# Patient Record
Sex: Male | Born: 1935 | Race: White | Hispanic: No | Marital: Single | Smoking: Former smoker
Health system: Southern US, Community
[De-identification: ages and names within clinical notes are randomized; demographics above are authoritative.]

## PROBLEM LIST (undated history)

## (undated) DIAGNOSIS — I5032 Chronic diastolic (congestive) heart failure: Secondary | ICD-10-CM

## (undated) DIAGNOSIS — E785 Hyperlipidemia, unspecified: Secondary | ICD-10-CM

## (undated) DIAGNOSIS — I208 Other forms of angina pectoris: Secondary | ICD-10-CM

## (undated) DIAGNOSIS — I2510 Atherosclerotic heart disease of native coronary artery without angina pectoris: Secondary | ICD-10-CM

## (undated) DIAGNOSIS — H409 Unspecified glaucoma: Secondary | ICD-10-CM

## (undated) DIAGNOSIS — I639 Cerebral infarction, unspecified: Secondary | ICD-10-CM

## (undated) DIAGNOSIS — I10 Essential (primary) hypertension: Secondary | ICD-10-CM

## (undated) DIAGNOSIS — E860 Dehydration: Secondary | ICD-10-CM

## (undated) HISTORY — DX: Cerebral infarction, unspecified: I63.9

## (undated) HISTORY — DX: Other forms of angina pectoris: I20.8

## (undated) HISTORY — PX: UMBILICAL HERNIA REPAIR: SHX196

## (undated) HISTORY — PX: CORONARY ARTERY BYPASS GRAFT: SHX141

## (undated) HISTORY — DX: Dehydration: E86.0

## (undated) HISTORY — DX: Chronic diastolic (congestive) heart failure: I50.32

## (undated) HISTORY — PX: CATARACT EXTRACTION, BILATERAL: SHX1313

---

## 2010-04-24 ENCOUNTER — Encounter: Admission: RE | Admit: 2010-04-24 | Discharge: 2010-05-04 | Payer: Self-pay | Source: Home / Self Care

## 2010-04-30 ENCOUNTER — Encounter (INDEPENDENT_AMBULATORY_CARE_PROVIDER_SITE_OTHER): Payer: Self-pay | Admitting: Emergency Medicine

## 2010-04-30 ENCOUNTER — Emergency Department (HOSPITAL_COMMUNITY)
Admission: EM | Admit: 2010-04-30 | Discharge: 2010-04-30 | Payer: Self-pay | Source: Home / Self Care | Admitting: Emergency Medicine

## 2010-05-09 ENCOUNTER — Encounter
Admission: RE | Admit: 2010-05-09 | Discharge: 2010-05-31 | Payer: Self-pay | Source: Home / Self Care | Attending: Family Medicine | Admitting: Family Medicine

## 2010-05-16 ENCOUNTER — Encounter: Admit: 2010-05-16 | Payer: Self-pay | Admitting: Family Medicine

## 2010-05-19 ENCOUNTER — Inpatient Hospital Stay (HOSPITAL_COMMUNITY)
Admission: EM | Admit: 2010-05-19 | Discharge: 2010-05-23 | Payer: Self-pay | Source: Home / Self Care | Attending: Internal Medicine | Admitting: Internal Medicine

## 2010-05-19 ENCOUNTER — Emergency Department (HOSPITAL_COMMUNITY)
Admission: EM | Admit: 2010-05-19 | Discharge: 2010-05-19 | Payer: Self-pay | Source: Home / Self Care | Admitting: Emergency Medicine

## 2010-05-22 ENCOUNTER — Encounter (INDEPENDENT_AMBULATORY_CARE_PROVIDER_SITE_OTHER): Payer: Self-pay | Admitting: Internal Medicine

## 2010-05-22 LAB — CARDIAC PANEL(CRET KIN+CKTOT+MB+TROPI)
CK, MB: 3 ng/mL (ref 0.3–4.0)
CK, MB: 4.2 ng/mL — ABNORMAL HIGH (ref 0.3–4.0)
Relative Index: 2.9 — ABNORMAL HIGH (ref 0.0–2.5)
Relative Index: 2.3 (ref 0.0–2.5)
Total CK: 104 U/L (ref 7–232)
Total CK: 183 U/L (ref 7–232)
Troponin I: 0.01 ng/mL (ref 0.00–0.06)
Troponin I: 0.01 ng/mL (ref 0.00–0.06)

## 2010-05-22 LAB — CBC
HCT: 38 % — ABNORMAL LOW (ref 39.0–52.0)
HCT: 33.8 % — ABNORMAL LOW (ref 39.0–52.0)
HCT: 35.3 % — ABNORMAL LOW (ref 39.0–52.0)
Hemoglobin: 12.5 g/dL — ABNORMAL LOW (ref 13.0–17.0)
Hemoglobin: 11.1 g/dL — ABNORMAL LOW (ref 13.0–17.0)
Hemoglobin: 11.5 g/dL — ABNORMAL LOW (ref 13.0–17.0)
MCH: 30.1 pg (ref 26.0–34.0)
MCH: 30.2 pg (ref 26.0–34.0)
MCH: 30 pg (ref 26.0–34.0)
MCHC: 32.9 g/dL (ref 30.0–36.0)
MCHC: 32.8 g/dL (ref 30.0–36.0)
MCHC: 32.6 g/dL (ref 30.0–36.0)
MCV: 91.6 fL (ref 78.0–100.0)
MCV: 91.8 fL (ref 78.0–100.0)
MCV: 92.2 fL (ref 78.0–100.0)
Platelets: 205 10*3/uL (ref 150–400)
Platelets: 188 10*3/uL (ref 150–400)
Platelets: 167 10*3/uL (ref 150–400)
RBC: 4.15 MIL/uL — ABNORMAL LOW (ref 4.22–5.81)
RBC: 3.68 MIL/uL — ABNORMAL LOW (ref 4.22–5.81)
RBC: 3.83 MIL/uL — ABNORMAL LOW (ref 4.22–5.81)
RDW: 13.9 % (ref 11.5–15.5)
RDW: 14.1 % (ref 11.5–15.5)
RDW: 14.3 % (ref 11.5–15.5)
WBC: 11.9 10*3/uL — ABNORMAL HIGH (ref 4.0–10.5)
WBC: 6.2 10*3/uL (ref 4.0–10.5)
WBC: 6.3 10*3/uL (ref 4.0–10.5)

## 2010-05-22 LAB — URINE CULTURE
Colony Count: 4000
Culture  Setup Time: 201201132201

## 2010-05-22 LAB — URINE MICROSCOPIC-ADD ON

## 2010-05-22 LAB — BASIC METABOLIC PANEL
BUN: 13 mg/dL (ref 6–23)
BUN: 9 mg/dL (ref 6–23)
CO2: 27 mEq/L (ref 19–32)
CO2: 26 mEq/L (ref 19–32)
Calcium: 9.1 mg/dL (ref 8.4–10.5)
Calcium: 8.7 mg/dL (ref 8.4–10.5)
Chloride: 106 mEq/L (ref 96–112)
Chloride: 110 mEq/L (ref 96–112)
Creatinine, Ser: 1.03 mg/dL (ref 0.4–1.5)
Creatinine, Ser: 0.95 mg/dL (ref 0.4–1.5)
GFR calc Af Amer: >60 mL/min (ref >60)
GFR calc Af Amer: >60 mL/min (ref >60)
GFR calc non Af Amer: >60 mL/min (ref >60)
GFR calc non Af Amer: >60 mL/min (ref >60)
Glucose, Bld: 187 mg/dL — ABNORMAL HIGH (ref 70–99)
Glucose, Bld: 95 mg/dL (ref 70–99)
Potassium: 4.2 mEq/L (ref 3.5–5.1)
Potassium: 4.1 mEq/L (ref 3.5–5.1)
Sodium: 141 mEq/L (ref 135–145)
Sodium: 141 mEq/L (ref 135–145)

## 2010-05-22 LAB — LIPID PANEL
Cholesterol: 104 mg/dL (ref 0–200)
HDL: 38 mg/dL — ABNORMAL LOW (ref >39)
LDL Cholesterol: 51 mg/dL (ref 0–99)
Total CHOL/HDL Ratio: 2.7 RATIO
Triglycerides: 76 mg/dL (ref <150)
VLDL: 15 mg/dL (ref 0–40)

## 2010-05-22 LAB — D-DIMER, QUANTITATIVE: D-Dimer, Quant: 5.73 ug/mL-FEU — ABNORMAL HIGH (ref 0.00–0.48)

## 2010-05-22 LAB — DIFFERENTIAL
Basophils Absolute: 0 10*3/uL (ref 0.0–0.1)
Basophils Relative: 0 % (ref 0–1)
Eosinophils Absolute: 0 10*3/uL (ref 0.0–0.7)
Eosinophils Relative: 0 % (ref 0–5)
Lymphocytes Relative: 7 % — ABNORMAL LOW (ref 12–46)
Lymphs Abs: 0.8 10*3/uL (ref 0.7–4.0)
Monocytes Absolute: 0.9 10*3/uL (ref 0.1–1.0)
Monocytes Relative: 7 % (ref 3–12)
Neutro Abs: 10.1 10*3/uL — ABNORMAL HIGH (ref 1.7–7.7)
Neutrophils Relative %: 85 % — ABNORMAL HIGH (ref 43–77)

## 2010-05-22 LAB — TROPONIN I: Troponin I: 0.02 ng/mL (ref 0.00–0.06)

## 2010-05-22 LAB — TSH: TSH: 2.723 u[IU]/mL (ref 0.350–4.500)

## 2010-05-22 LAB — URINALYSIS, ROUTINE W REFLEX MICROSCOPIC
Bilirubin Urine: NEGATIVE
Hgb urine dipstick: NEGATIVE
Ketones, ur: 15 mg/dL — AB
Leukocytes, UA: NEGATIVE
Nitrite: NEGATIVE
Protein, ur: 30 mg/dL — AB
Specific Gravity, Urine: 1.025 (ref 1.005–1.030)
Urine Glucose, Fasting: NEGATIVE mg/dL
Urobilinogen, UA: 1 mg/dL (ref 0.0–1.0)
pH: 5.5 (ref 5.0–8.0)

## 2010-05-22 LAB — POCT CARDIAC MARKERS
CKMB, poc: 1.2 ng/mL (ref 1.0–8.0)
Myoglobin, poc: 152 ng/mL (ref 12–200)
Troponin i, poc: <0.05 ng/mL (ref 0.00–0.09)

## 2010-05-22 LAB — CK TOTAL AND CKMB (NOT AT ARMC)
CK, MB: 3 ng/mL (ref 0.3–4.0)
Relative Index: 2.9 — ABNORMAL HIGH (ref 0.0–2.5)
Total CK: 105 U/L (ref 7–232)

## 2010-05-22 LAB — RPR: RPR Ser Ql: NONREACTIVE

## 2010-05-22 LAB — FOLATE: Folate: >20 ng/mL

## 2010-05-22 LAB — LACTIC ACID, PLASMA: Lactic Acid, Venous: 2.8 mmol/L — ABNORMAL HIGH (ref 0.5–2.2)

## 2010-05-22 LAB — VITAMIN B12: Vitamin B-12: 982 pg/mL — ABNORMAL HIGH (ref 211–911)

## 2010-05-23 ENCOUNTER — Encounter: Admit: 2010-05-23 | Payer: Self-pay | Admitting: Family Medicine

## 2010-05-27 NOTE — H&P (Addendum)
NAME:  Bruce Clarke, Bruce Clarke                ACCOUNT NO.:  [id number]  MEDICAL RECORD NO.:  [id number]          PATIENT TYPE:  EMS  LOCATION:  MAJO                         FACILITY:  [facility]  PHYSICIAN:  Homero Fellers, MD   DATE OF BIRTH:  19-Jan-1917  DATE OF ADMISSION:  05/19/2010 DATE OF DISCHARGE:                             HISTORY & PHYSICAL   PRIMARY CARE PHYSICIAN:  None.  CHIEF COMPLAINT:  Seizure and nausea.  HISTORY OF PRESENT ILLNESS:  This is 77 year old Caucasian gentleman who currently lives with his daughter.  He presented to the emergency room after an episode of seizure while getting ready to have dinner earlier today.  There was no family member present at the bedside to give me a witness account but going by emergency room record, the patient was said to have slumped over with a glossy eye look and then had a convulsive episode followed by several episodes of vomiting.  There was short postictal phase but the patient is currently alert and oriented and pretty much aware of his environment.  According to the patient, there is no prior history of seizure.  The patient had bypass heart surgery about a month and half ago at [facility] in [facility] and has been doing well post surgery.  He currently denies any chest pain, shortness of breath or fever.  The patient also denies any headache or blurred vision.  He is currently having nausea.  He vomited after his seizure activity.  His initial vital signs in the emergency room did not show any evidence of fever or tachycardia.  Other history is limited because of the patient unwilling to provide further information because of his nausea.  PAST MEDICAL HISTORY:  Includes coronary artery disease, status post CABG; high blood pressure; hyperlipidemia.  CURRENT MEDICATIONS:  He is currently on amiodarone, which could be for atrial fibrillation, which is not verified at this time.  Medications include zolpidem,  Tylenol, Zocor, metoprolol, Colace, aspirin, and amiodarone.  ALLERGIES:  IBUPROFEN.  SOCIAL HISTORY:  No smoking.  No alcohol use.  FAMILY HISTORY:  Noncontributory.  REVIEW OF SYSTEMS:  Ten-point review of systems is still as above.  PHYSICAL EXAMINATION:  VITAL SIGNS:  Blood pressure is 145/44, pulse 50- 74, respirations 22, temp 97.9. GENERAL:  The patient is lying in bed, appeared to be uncomfortable because of his nausea. HEENT:  Pallor.  Extraocular muscles are intact. NECK:  Supple.  No JVD, adenopathy, or thyromegaly. LUNGS: Show reduced breath sounds at left lung base with no wheezing, no crackles. HEART:  S1, S2 regular rate and rhythm.  No murmurs, rubs or gallop. ABDOMEN:  Full, soft, nontender.  Bowel sound present.  No masses. EXTREMITIES:  Trace edema bilaterally. NEUROLOGIC:  No deficits.  Speech is clear.  LABORATORY:  His urinalysis does not suggest urinary tract infection. White count is 11.9, hemoglobin 12.5, platelet count is 205.  Chemistry; sodium is 141, potassium 4.2, BUN 13, creatinine 1.03.  Cardiac monitor showed no evidence of myocardial injury.  His EKG showed sinus rhythm with left bundle-branch block.  There is no old EKG to compare with.  Head CT showed no acute findings.  Chest x-ray showed left lower lobe infiltrate and consolidation with possible effusion.  There is no pulmonary edema.  ASSESSMENT:  A 77 year old man admitted with: 1. New onset seizure activity. 2. Left lower lobe pneumonia. 3. Mild leucocytosis. 4. High blood pressure. 5. Hyperlipidemia. 6. History of coronary disease, status post recent CABG.  PLAN: 1. The patient admitted to telemetry.  It is not clear at this point     to what could have precipitated his seizure activity.  His initial     head CT was normal.  He may also need a brain MRI on this     admission.  I will also recommend a neurological evaluation.  This     can be obtained tomorrow by the hospitalist.   For now, we will     start him on Keppra 500 mg IV q.12 and use Ativan p.r.n. seizure     activity.  I will also do a 2D echo and carotid Doppler and check     his lipid profile and TSH, B12 and folic acid levels. 2. Left lower lobe pneumonia.  The patient to be commenced on     antibiotics.  It is unclear whether pneumonia could be precipitant     for his seizure.  Blood cultures to be sent. 3. Leukocytosis, this may secondary to infection. 4. Hyperlipidemia.  Continue Zocor.  Estimated time spent is about 42 minutes.  Overall condition is stable.     Homero Fellers, MD     FA/MEDQ  D:  05/20/2010  T:  05/20/2010  Job:  [id number]  Electronically Signed by Homero Fellers  on 05/27/2010 10:47:05 PM

## 2010-05-29 LAB — CULTURE, BLOOD (ROUTINE X 2)
Culture  Setup Time: 201201140608
Culture  Setup Time: 201201140608
Culture: NO GROWTH
Culture: NO GROWTH

## 2010-05-31 ENCOUNTER — Encounter: Admit: 2010-05-31 | Payer: Self-pay | Admitting: Family Medicine

## 2010-06-02 ENCOUNTER — Inpatient Hospital Stay (HOSPITAL_COMMUNITY)
Admission: AD | Admit: 2010-06-02 | Discharge: 2010-06-07 | DRG: 603 | Disposition: A | Discharge status: Home / Self Care | Payer: Medicare Other | Source: Other Acute Inpatient Hospital | Attending: Internal Medicine | Admitting: Internal Medicine

## 2010-06-02 ENCOUNTER — Emergency Department (HOSPITAL_BASED_OUTPATIENT_CLINIC_OR_DEPARTMENT_OTHER)
Admission: EM | Admit: 2010-06-02 | Discharge: 2010-06-02 | Disposition: A | Discharge status: Other Acute Inpatient Hospital | Payer: Self-pay | Source: Home / Self Care | Admitting: Emergency Medicine

## 2010-06-02 DIAGNOSIS — I872 Venous insufficiency (chronic) (peripheral): Secondary | ICD-10-CM | POA: Diagnosis present

## 2010-06-02 DIAGNOSIS — E236 Other disorders of pituitary gland: Secondary | ICD-10-CM | POA: Diagnosis present

## 2010-06-02 DIAGNOSIS — Z951 Presence of aortocoronary bypass graft: Secondary | ICD-10-CM

## 2010-06-02 DIAGNOSIS — D649 Anemia, unspecified: Secondary | ICD-10-CM | POA: Diagnosis present

## 2010-06-02 DIAGNOSIS — L02419 Cutaneous abscess of limb, unspecified: Principal | ICD-10-CM | POA: Diagnosis present

## 2010-06-02 DIAGNOSIS — M169 Osteoarthritis of hip, unspecified: Secondary | ICD-10-CM | POA: Diagnosis present

## 2010-06-02 DIAGNOSIS — M7989 Other specified soft tissue disorders: Secondary | ICD-10-CM

## 2010-06-02 DIAGNOSIS — I10 Essential (primary) hypertension: Secondary | ICD-10-CM | POA: Diagnosis present

## 2010-06-02 DIAGNOSIS — M79609 Pain in unspecified limb: Secondary | ICD-10-CM

## 2010-06-02 DIAGNOSIS — I2510 Atherosclerotic heart disease of native coronary artery without angina pectoris: Secondary | ICD-10-CM | POA: Diagnosis present

## 2010-06-02 DIAGNOSIS — M1610 Unilateral primary osteoarthritis, unspecified hip: Secondary | ICD-10-CM | POA: Diagnosis present

## 2010-06-02 DIAGNOSIS — L03119 Cellulitis of unspecified part of limb: Principal | ICD-10-CM | POA: Diagnosis present

## 2010-06-02 DIAGNOSIS — E785 Hyperlipidemia, unspecified: Secondary | ICD-10-CM | POA: Diagnosis present

## 2010-06-02 DIAGNOSIS — Z7982 Long term (current) use of aspirin: Secondary | ICD-10-CM

## 2010-06-02 DIAGNOSIS — N400 Enlarged prostate without lower urinary tract symptoms: Secondary | ICD-10-CM | POA: Diagnosis present

## 2010-06-02 DIAGNOSIS — Z87891 Personal history of nicotine dependence: Secondary | ICD-10-CM

## 2010-06-02 LAB — COMPREHENSIVE METABOLIC PANEL
ALT: 19 U/L (ref 0–53)
AST: 22 U/L (ref 0–37)
Albumin: 3.9 g/dL (ref 3.5–5.2)
CO2: 28 mEq/L (ref 19–32)
Chloride: 104 mEq/L (ref 96–112)
GFR calc Af Amer: >60 mL/min (ref >60)
GFR calc non Af Amer: >60 mL/min (ref >60)
Potassium: 4.7 mEq/L (ref 3.5–5.1)
Sodium: 144 mEq/L (ref 135–145)
Total Bilirubin: 0.9 mg/dL (ref 0.3–1.2)

## 2010-06-02 LAB — DIFFERENTIAL
Basophils Absolute: 0 10*3/uL (ref 0.0–0.1)
Basophils Relative: 0 % (ref 0–1)
Eosinophils Absolute: 0.1 10*3/uL (ref 0.0–0.7)
Eosinophils Relative: 1 % (ref 0–5)
Lymphocytes Relative: 18 % (ref 12–46)
Lymphs Abs: 1.3 10*3/uL (ref 0.7–4.0)
Monocytes Absolute: 0.8 10*3/uL (ref 0.1–1.0)
Monocytes Relative: 11 % (ref 3–12)
Neutro Abs: 5 10*3/uL (ref 1.7–7.7)
Neutrophils Relative %: 70 % (ref 43–77)

## 2010-06-02 LAB — CBC
HCT: 36.3 % — ABNORMAL LOW (ref 39.0–52.0)
Hemoglobin: 12.3 g/dL — ABNORMAL LOW (ref 13.0–17.0)
MCH: 30.1 pg (ref 26.0–34.0)
MCHC: 33.9 g/dL (ref 30.0–36.0)
MCV: 88.8 fL (ref 78.0–100.0)
Platelets: 263 10*3/uL (ref 150–400)
RBC: 4.09 MIL/uL — ABNORMAL LOW (ref 4.22–5.81)
RDW: 14.1 % (ref 11.5–15.5)
WBC: 7.1 10*3/uL (ref 4.0–10.5)

## 2010-06-03 LAB — COMPREHENSIVE METABOLIC PANEL
ALT: 10 U/L (ref 0–53)
Alkaline Phosphatase: 74 U/L (ref 39–117)
BUN: 8 mg/dL (ref 6–23)
CO2: 28 mEq/L (ref 19–32)
Calcium: 8.4 mg/dL (ref 8.4–10.5)
GFR calc non Af Amer: >60 mL/min (ref >60)
Glucose, Bld: 90 mg/dL (ref 70–99)
Potassium: 3.8 mEq/L (ref 3.5–5.1)
Total Protein: 5.1 g/dL — ABNORMAL LOW (ref 6.0–8.3)

## 2010-06-03 LAB — CBC
HCT: 33.4 % — ABNORMAL LOW (ref 39.0–52.0)
Hemoglobin: 11 g/dL — ABNORMAL LOW (ref 13.0–17.0)
MCHC: 32.9 g/dL (ref 30.0–36.0)
MCV: 90.3 fL (ref 78.0–100.0)
RDW: 14.6 % (ref 11.5–15.5)

## 2010-06-04 LAB — CBC
HCT: 35.5 % — ABNORMAL LOW (ref 39.0–52.0)
Hemoglobin: 11.4 g/dL — ABNORMAL LOW (ref 13.0–17.0)
MCHC: 32.1 g/dL (ref 30.0–36.0)
RBC: 3.88 MIL/uL — ABNORMAL LOW (ref 4.22–5.81)
WBC: 5.4 10*3/uL (ref 4.0–10.5)

## 2010-06-04 LAB — DIFFERENTIAL
Basophils Absolute: 0 10*3/uL (ref 0.0–0.1)
Basophils Relative: 1 % (ref 0–1)
Lymphocytes Relative: 26 % (ref 12–46)
Monocytes Absolute: 0.7 10*3/uL (ref 0.1–1.0)
Neutro Abs: 3 10*3/uL (ref 1.7–7.7)
Neutrophils Relative %: 56 % (ref 43–77)

## 2010-06-04 LAB — COMPREHENSIVE METABOLIC PANEL
ALT: 10 U/L (ref 0–53)
AST: 17 U/L (ref 0–37)
Alkaline Phosphatase: 82 U/L (ref 39–117)
CO2: 26 mEq/L (ref 19–32)
Calcium: 8.6 mg/dL (ref 8.4–10.5)
Chloride: 102 mEq/L (ref 96–112)
GFR calc Af Amer: >60 mL/min (ref >60)
GFR calc non Af Amer: >60 mL/min (ref >60)
Glucose, Bld: 103 mg/dL — ABNORMAL HIGH (ref 70–99)
Potassium: 3.7 mEq/L (ref 3.5–5.1)
Sodium: 139 mEq/L (ref 135–145)

## 2010-06-04 LAB — MAGNESIUM: Magnesium: 2.1 mg/dL (ref 1.5–2.5)

## 2010-06-05 LAB — MAGNESIUM: Magnesium: 2.1 mg/dL (ref 1.5–2.5)

## 2010-06-05 LAB — CBC
HCT: 36.6 % — ABNORMAL LOW (ref 39.0–52.0)
MCHC: 32.8 g/dL (ref 30.0–36.0)
MCV: 90.4 fL (ref 78.0–100.0)
Platelets: 272 10*3/uL (ref 150–400)
RDW: 14.5 % (ref 11.5–15.5)

## 2010-06-05 LAB — COMPREHENSIVE METABOLIC PANEL
Albumin: 3.1 g/dL — ABNORMAL LOW (ref 3.5–5.2)
BUN: 12 mg/dL (ref 6–23)
Calcium: 8.6 mg/dL (ref 8.4–10.5)
Glucose, Bld: 93 mg/dL (ref 70–99)
Sodium: 139 mEq/L (ref 135–145)
Total Protein: 5.3 g/dL — ABNORMAL LOW (ref 6.0–8.3)

## 2010-06-05 LAB — DIFFERENTIAL
Basophils Absolute: 0 10*3/uL (ref 0.0–0.1)
Eosinophils Absolute: 0.2 10*3/uL (ref 0.0–0.7)
Eosinophils Relative: 3 % (ref 0–5)
Lymphocytes Relative: 32 % (ref 12–46)
Monocytes Absolute: 0.7 10*3/uL (ref 0.1–1.0)

## 2010-06-05 LAB — VANCOMYCIN, TROUGH: Vancomycin Tr: 8.8 ug/mL — ABNORMAL LOW (ref 10.0–20.0)

## 2010-06-06 DIAGNOSIS — I739 Peripheral vascular disease, unspecified: Secondary | ICD-10-CM

## 2010-06-06 LAB — CORTISOL-AM, BLOOD: Cortisol - AM: 8.9 ug/dL (ref 4.3–22.4)

## 2010-06-07 ENCOUNTER — Encounter: Payer: Self-pay | Admitting: Physical Therapy

## 2010-06-07 LAB — CBC
Hemoglobin: 11.8 g/dL — ABNORMAL LOW (ref 13.0–17.0)
MCH: 29.4 pg (ref 26.0–34.0)
RBC: 4.01 MIL/uL — ABNORMAL LOW (ref 4.22–5.81)
RDW: 14.4 % (ref 11.5–15.5)
WBC: 5.8 10*3/uL (ref 4.0–10.5)

## 2010-06-07 LAB — BASIC METABOLIC PANEL
BUN: 9 mg/dL (ref 6–23)
Calcium: 8.8 mg/dL (ref 8.4–10.5)
GFR calc non Af Amer: >60 mL/min (ref >60)
Glucose, Bld: 93 mg/dL (ref 70–99)

## 2010-06-08 LAB — CULTURE, BLOOD (ROUTINE X 2)
Culture  Setup Time: 201201271948
Culture: NO GROWTH

## 2010-06-08 NOTE — Discharge Summary (Signed)
NAME:  Bruce Clarke, Bruce Clarke                ACCOUNT NO.:  [id number]  MEDICAL RECORD NO.:  [id number]           PATIENT TYPE:  LOCATION:                                 FACILITY:  PHYSICIAN:  Ladell Pier, M.D.   DATE OF BIRTH:  [birth year]  DATE OF ADMISSION:  05/20/2010 DATE OF DISCHARGE:  05/23/2010                              DISCHARGE SUMMARY   DISCHARGE DIAGNOSES: 1. Question of seizure versus a near syncopal episode. 2. Left lower lobe pneumonia. 3. Mild leukocytosis. 4. Hypertension. 5. Hyperlipidemia. 6. History of coronary artery disease status post coronary artery     bypass graft at [facility] in [place].  The patient has a followup appointment with the cardiologist on February 2.  DISCHARGE MEDICATIONS: 1. Levaquin 750 mg daily. 2. Tylenol p.r.n. 3. Amiodarone 200 mg daily. 4. Aspirin 325 mg daily. 5. Colace 100 mg daily. 6. Metoprolol 25 mg b.i.d. 7. Simvastatin 20 mg daily. 8. Tylenol over-the-counter with Benadryl daily at bedtime as needed     for sleep. 9. Ambien 5 mg at nighttime p.r.n.  FOLLOWUP APPOINTMENTS:  The patient to follow up with Dr. Azucena Cecil in 1 week.  PROCEDURE:  The patient had an EEG and MRI done that is still pending.  OTHER PROCEDURES: 1. CT of the chest.  Left lower lobe consolidation with associated     left pleural effusion, no evidence of PE. 2. Chest x-ray.  Moderate-size left pleural effusion and associated     atelectasis versus pneumonia in the left lower lobe. 3. CT scan of the head.  No acute intracranial abnormality.  Mild age-     appropriate generalized atrophy and mild chronic microvascular     ischemic changes. 4. X-ray of his left hip.  No acute finding.  Bilateral hip     osteoarthritis worse on the left. 5. X-ray of the left wrist.  No acute finding. 6. 2-D echo.  Left ventricle has some septal hypokinesis.  The cavity     size was normal, wall thickness was increased in a pattern of LVH,     systolic  function was normal, estimated EF of 50-55%.  There was no     regional wall motion abnormalities.  Atrium was mildly dilated, no     defect or patent foramen ovale was identified. 7. Carotid Dopplers.  No stenosis.  CONSULTANTS:  None.  HISTORY OF PRESENT ILLNESS:  The patient is a 77 year old Caucasian male who currently lives with his daughter after he had CABG.  He presented to the emergency room with one episode of seizure-like activity while getting ready to have dinner earlier today.  There was no family member present at the bedside to give history, but later on per discussion with the daughter, he had fallen earlier that day.  He had gone to the emergency room, was there all day, had x-rays done that were normal.  Then he came home, and then when he came home, he said he felt lightheaded.  When she went to get him a chair, he fell in the chair and then he had some jerky movements.  His eyes  rolled back and that lasted for a few minutes and then he regained consciousness and was able to answer questions.  He had no postictal episode.  The patient was brought to the emergency room.  Please see admission note for remainder of history, past medical history.  The patient also had several episodes of vomiting during the event.  Past medical history, family history, social history, meds, allergies, review of systems per admission H&P.  PHYSICAL EXAM AT THE TIME OF DISCHARGE:  VITAL SIGNS:  Temperature 97.3, pulse 66, respirations 18, blood pressure 151/74, pulse ox 95% on room air. GENERAL:  The patient was sitting up in chair today, well-nourished white male. HEENT:  Normocephalic, atraumatic.  Pupils reactive to light.  Throat without erythema. CARDIOVASCULAR:  Regular rate and rhythm. LUNGS:  Clear bilaterally. ABDOMEN:  Positive bowel sounds. EXTREMITIES:  Without edema.  HOSPITAL COURSE: 1. Question of seizure versus syncope/near syncope:  The patient was     admitted  to the hospital.  He had an MRI done yesterday, the result     is still pending.  He had CT scan of the chest to rule out PE that     was normal.  Head CT was normal.  Carotid Dopplers was negative.     He had a 2-D echo done that showed normal EF.  So, the etiology of     his syncope is not really known.  One of his troponin was normal at     0.01.  His MB was really mildly elevated at 4.2, but his relative     index was 2.3.  A D-dimer was elevated at 5.73 with negative CT     scan, so I am unsure of the etiology of his syncopal episode.  He     could have had an arrhythmia, but no arrhythmia so far on the     monitor here.  He will follow up with Dr. Azucena Cecil in the office and     then he could well keep his appointment to follow up at [facility].     His EEG is still pending.  I did discuss this with Dr. Thad Ranger,     Neurology, who feel that it does not sound like a seizure episode     since he does not have a postictal phase.  He is presently on     Keppra in the hospital, but per discussion with Neurology if his     EEG is normal, then discontinue the Keppra and he can be discharged     without any followup neurology wise and to follow up with PCP.  He     does not need any further neuro workup.  So the EEG and MRI is     pending. 2. Coronary artery disease status post CABG that is stable and he is     going to follow up outpatient with the cardiologist. 3. Hypertension.  We will continue him on his home medication. 4. Pneumonia.  He received Levaquin in the hospital.  He does not have     any shortness of breath.  He is able to ambulate fine without any     assistance.  He will be discharged on Levaquin for 5 more days and     then as mentioned before he will follow up with Dr. Azucena Cecil in the     office.  LABS AT THE TIME OF DISCHARGE:  Blood cultures negative x2.  Urine culture negative.  Sodium of  141, potassium 4.1, chloride 110, CO2 26, glucose 95, BUN 9, creatinine 0.95.  WBC  6.3, hemoglobin 11.5, MCV 92.2, platelet 167, RPR nonreactive.  Folate level greater than 20, vitamin B12 982, TSH of 2.723.  Lipid profile; total cholesterol 104, triglycerides 76, HDL 38, LDL 51.  D-dimer as mentioned before was 5.73. Lactic acid level 2.8.  Time spent with the patient and doing this discharge and discussing followup appointment with his daughter is approximately 45 minutes.     Ladell Pier, M.D.     NJ/MEDQ  D:  05/23/2010  T:  05/23/2010  Job:  [id number]  cc:   Dr. Genia Harold, M.D.  Electronically Signed by Ladell Pier M.D. on 06/08/2010 01:38:55 PM

## 2010-06-08 NOTE — Discharge Summary (Signed)
  NAME:  Bruce Clarke, Bruce Clarke                ACCOUNT NO.:  [id number]  MEDICAL RECORD NO.:  [id number]          PATIENT TYPE:  INP  LOCATION:  3707                         FACILITY:  [facility]  PHYSICIAN:  Ladell Pier, M.D.   DATE OF BIRTH:  1916-08-09  DATE OF ADMISSION:  05/19/2010 DATE OF DISCHARGE:  05/23/2010                              DISCHARGE SUMMARY   ADDENDUM:  The patient was discharged on January 17.  The patient had a MRI done that was negative.  His EEG was also negative.  The patient and daughter were told to follow up with the daughters PCP, Dr. Azucena Cecil.     Ladell Pier, M.D.     N[place]/MEDQ  D:  05/23/2010  T:  05/23/2010  Job:  [id number]  cc:   Tally Joe, M.D.  Electronically Signed by Ladell Pier M.D. on 06/08/2010 01:39:00 PM
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
## 2010-06-12 ENCOUNTER — Ambulatory Visit: Payer: Medicare Other | Attending: Family Medicine | Admitting: Physical Therapy

## 2010-06-12 DIAGNOSIS — M6281 Muscle weakness (generalized): Secondary | ICD-10-CM | POA: Insufficient documentation

## 2010-06-12 DIAGNOSIS — R5381 Other malaise: Secondary | ICD-10-CM | POA: Insufficient documentation

## 2010-06-12 DIAGNOSIS — IMO0001 Reserved for inherently not codable concepts without codable children: Secondary | ICD-10-CM | POA: Insufficient documentation

## 2010-06-13 ENCOUNTER — Ambulatory Visit: Payer: Medicare Other | Admitting: Physical Therapy

## 2010-06-14 ENCOUNTER — Ambulatory Visit: Payer: Medicare Other | Admitting: Physical Therapy

## 2010-06-15 ENCOUNTER — Ambulatory Visit (INDEPENDENT_AMBULATORY_CARE_PROVIDER_SITE_OTHER): Payer: Medicare Other | Admitting: Vascular Surgery

## 2010-06-15 ENCOUNTER — Encounter (INDEPENDENT_AMBULATORY_CARE_PROVIDER_SITE_OTHER): Payer: Medicare Other

## 2010-06-15 DIAGNOSIS — I872 Venous insufficiency (chronic) (peripheral): Secondary | ICD-10-CM

## 2010-06-15 DIAGNOSIS — I824Z9 Acute embolism and thrombosis of unspecified deep veins of unspecified distal lower extremity: Secondary | ICD-10-CM
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
## 2010-06-19 NOTE — Discharge Summary (Signed)
NAME:  Bruce Clarke, Bruce Clarke                ACCOUNT NO.:  [id number]  MEDICAL RECORD NO.:  [id number]           PATIENT TYPE:  I  LOCATION:  4508                         FACILITY:  [facility]  PHYSICIAN:  Marcellus Scott, MD     DATE OF BIRTH:  1916/09/22  DATE OF ADMISSION:  06/02/2010 DATE OF DISCHARGE:  06/07/2010                              DISCHARGE SUMMARY   PRIMARY CARE PHYSICIAN:  Dr. Karna Christmas.  VASCULAR SURGEON:  Janetta Hora. Fields, MD  CARDIOLOGIST:  Dr. Romilda Garret.  CARDIOTHORACIC SURGEON:  Dr. Delilah Shan at [facility].  DISCHARGE DIAGNOSES: 1. Left lower extremity cellulitis complicating underlying venous     stasis. 2. Left lower extremity venous insufficiency. 3. Coronary artery disease status post coronary artery bypass graft in     November 2011. 4. Empty sella on MRI, asymptomatic.  Outpatient followup as deemed     necessary. 5. Hypertension. 6. History of benign prostatic hypertrophy. 7. History of osteoarthritis. 8. History of dyslipidemia. 9. History of question seizures versus syncope on May 20, 2010. 10.Normocytic anemia.  DISCHARGE MEDICATIONS: 1. Doxycycline 100 mg p.o. b.i.d. 2. Acetaminophen 325-650 mg p.o. q.4 hourly p.r.n. for pain. 3. Amiodarone 200 mg p.o. daily. 4. Aspirin 325 mg p.o. daily. 5. Colace 200 mg p.o. every evening. 6. Metoprolol tartrate 12.5 mg p.o. b.i.d. 7. Simvastatin 20 mg p.o. every evening. 8. Vitamin B12 1 tablet p.o. daily. 9. Vitamin D over the counter 1 capsule p.o. daily. 10.Zolpidem 5 mg p.o. at bedtime p.r.n.  DISCONTINUED MEDICATION:  Tylenol PM over the counter.  IMAGING: 1. CT of the left lower extremity with contrast on the June 05, 2010.  Impression:  CT findings suggest cellulitis without evidence     of focal soft tissue abscess, myofascitis, or osteomyelitis. 2. X-ray of the lumbar spine.  Impression:  Normal alignment with     degenerative change  involving the facet joints of L5-S1.  No acute     abnormality. 3. Left lower extremity venous Doppler.  Impression:  No evidence of     lower extremity deep vein thrombosis. 4. Lower extremity arterial Dopplers.  Summary:  Right is 0.78, left     0.77 ankle brachial index.  LABORATORY DATA:  ACTH is 7 with normal range being 10-46.  Basic metabolic panel within normal limits.  BUN 9, creatinine 0.98.  CBC with hemoglobin 11.8, hematocrit 36.7, white blood cell 5.8, platelets 259, MCV 92.  A.m. cortisol 8.9, TSH 1.591, magnesium 2.1.  Hepatic panel only remarkable for total protein 5.3 and albumin of 3.1.  Blood cultures x2 are no growth to date.  ESR on admission was 17.  Venous lactate on admission was 2.  INR 1.04 and D-dimer was 2.37.  CONSULTATIONS:  Vascular surgeon, Dr. Fabienne Bruns.  DIET:  Heart-healthy diet.  ACTIVITIES:  Increase activity slowly.  The patient will also use a rolling walker with 5-inch wheels.  The patient will require Ace wrap to the left lower extremity foot to knee to be changed once daily.  COMPLAINTS TODAY:  The patient indicates that  the left lower extremity pain, swelling, redness has decreased by 80%, and he feels significantly better and comfortable to return home.  He is ambulating in the room. He denies any chest pain or dyspnea or dizziness or lightheadedness.  He denies any other complaints.  PHYSICAL EXAMINATION:  GENERAL:  The patient is in no obvious distress. VITAL SIGNS:  Temperature 97.6 degrees Fahrenheit; pulse was recorded as 45 per minute, but is actually 55 per minute at this time and ranges between 45-65 per minute; respirations 18 per minute, blood pressure 136/56 mmHg, and saturating at 98% on room air. NECK:  No JVD. RESPIRATORY:  Clear. CARDIOVASCULAR:  First and second heart sounds heard, regular. ABDOMEN:  Nondistended, nontender, soft, and bowel sounds present. CENTRAL NERVOUS SYSTEM:  The patient awake, alert,  oriented x3 with no focal neurological deficits. EXTREMITIES:  Grade 5/5 power.  Left lower extremity is mildly swollen below the knee with mild warmth, but no open wounds or redness or tenderness.  Peripheral pulses symmetrically felt.  HOSPITAL COURSE:  Mr. Stanislawski is a 77 year old Caucasian male patient with history of hypotension, hyperlipidemia, coronary artery disease status post coronary artery bypass graft, benign prostatic hypertrophy, empty sella on a recent MRI, recent admission for pneumonia who presented with left leg pain of 1 week duration.  There was some slight warmth.  Ultrasound on arrival was negative for deep vein thrombosis. He was now admitted for presumed cellulitis of the left lower extremity.  1. Left lower extremity cellulitis complicating underlying chronic     venous stasis.  The patient was admitted to the hospital.  As     indicated, DVT was ruled out.  He was placed on IV vancomycin and     Zosyn of which today he has completed 5 days.  His ankle brachial     indices were done, and the patient may have mild peripheral artery     disease.  Vascular surgeons were consulted, and they recommended     Ace wrapping his left lower extremity for the venous insufficiency,     and they will also follow him as an outpatient.  I have discussed     with Dr. Darrick Penna who has cleared him for discharge.  The patient     will be discharged on additional 10 days of oral antibiotics. 2. Coronary artery disease status post coronary artery bypass graft.     The patient is asymptomatic of any cardiorespiratory symptoms.  He     is actually on 12.5 p.o. b.i.d. of metoprolol at home, but was     placed on 25 b.i.d. in the hospital, which may contribute to some     of the bradycardia.  He is asymptomatic of this, however.  He will     be returning to his home dose of metoprolol.  I am unsure why he is     on the amiodarone.  He can follow up with his cardiologist. 3. Hypertension.   Reasonably controlled on above medications. 4. Empty sella on recent MRI.  The patient, however, is asymptomatic     and unclear as to the reason of his passing out recently.  Unsure     what to make out of the low TSH.  Recommend outpatient followup and     evaluation as deemed necessary.  Consider Neurology consultation if     deemed necessary.  DISPOSITION:  The patient's daughter and son-in-law were concerned about his living conditions and the safety of the patient  returning home to be alone.  However, long discussion has been held by the case manager with the daughter and the patient.  The patient will be returning to outpatient therapy, which was arranged by the [facility] and after that, the daughter may consider placing him at an assisted living facility as an outpatient.  The patient at this time is stable for discharge home.  FOLLOWUP RECOMMENDATIONS: 1. Dr. Karna Christmas.  The patient is to call for an appointment to     be seen in 5-7 days. 2. Dr. Fabienne Bruns.  MD's office will call with the appointment. 3. Dr. Romilda Garret.  The patient is to call for an appointment. 4. Dr. Delilah Shan.  The patient is to call for an appointment.  Time taken in coordinating this discharge is 35 minutes.     Marcellus Scott, MD     AH/MEDQ  D:  06/07/2010  T:  06/08/2010  Job:  [id number]  cc:   Dr. Karna Christmas Janetta Hora. Darrick Penna, MD Dr. Romilda Garret Dr. Delilah Shan  Electronically Signed by Marcellus Scott MD on 06/19/2010 12:00:07 AM
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
## 2010-06-29 ENCOUNTER — Ambulatory Visit: Payer: Medicare Other | Admitting: Vascular Surgery

## 2010-06-30 NOTE — Procedures (Unsigned)
DUPLEX DEEP VENOUS EXAM - LOWER EXTREMITY  INDICATION:  Left venous insufficiency.  HISTORY:  Edema:  Left lower extremity. Trauma/Surgery:  No. Pain:  Yes. PE:  No. Previous DVT:  Possibly in 1945. Anticoagulants:  No. Other:  Left lower extremity recurrent cellulitis.  DUPLEX EXAM:               CFV   SFV   PopV  PTV    GSV               R  L  R  L  R  L  R   L  R  L Thrombosis    O  o     o     o      P     o Spontaneous   +  +     +     +      +     + Phasic        +  +     +     +      +     + Augmentation  +  +     +     +      +     + Compressible  +  +     +     P      +     + Competent     +  0     0     0      +     +  Legend:  + - yes  o - no  p - partial  D - decreased  IMPRESSION: 1. Evidence of chronic DVT in the popliteal vein. 2. Unable to adequately visualize the left lower extremity calf veins     due to edema. 3. Clinically significant deep vein insufficiency is noted.   _____________________________ Janetta Hora Darrick Penna, MD  EM/MEDQ  D:  06/15/2010  T:  06/15/2010  Job:  [id number]

## 2010-07-17 LAB — DIFFERENTIAL
Basophils Relative: 1 % (ref 0–1)
Eosinophils Absolute: 0.4 10*3/uL (ref 0.0–0.7)
Neutrophils Relative %: 46 % (ref 43–77)

## 2010-07-17 LAB — CBC
MCH: 30.6 pg (ref 26.0–34.0)
MCHC: 32.9 g/dL (ref 30.0–36.0)
Platelets: 170 10*3/uL (ref 150–400)
RDW: 14 % (ref 11.5–15.5)

## 2011-02-28 DIAGNOSIS — I9789 Other postprocedural complications and disorders of the circulatory system, not elsewhere classified: Secondary | ICD-10-CM

## 2011-02-28 DIAGNOSIS — I4891 Unspecified atrial fibrillation: Secondary | ICD-10-CM | POA: Insufficient documentation

## 2012-05-29 DIAGNOSIS — I739 Peripheral vascular disease, unspecified: Secondary | ICD-10-CM | POA: Insufficient documentation

## 2016-01-06 DIAGNOSIS — I639 Cerebral infarction, unspecified: Secondary | ICD-10-CM

## 2016-01-06 HISTORY — DX: Cerebral infarction, unspecified: I63.9

## 2016-01-29 ENCOUNTER — Inpatient Hospital Stay (HOSPITAL_COMMUNITY)
Admission: EM | Admit: 2016-01-29 | Discharge: 2016-02-02 | DRG: 064 | Disposition: A | Discharge status: Skilled Nursing Facility | Payer: Medicare Other | Attending: Internal Medicine | Admitting: Internal Medicine

## 2016-01-29 ENCOUNTER — Encounter (HOSPITAL_COMMUNITY): Payer: Self-pay | Admitting: *Deleted

## 2016-01-29 ENCOUNTER — Emergency Department (HOSPITAL_COMMUNITY): Payer: Medicare Other

## 2016-01-29 DIAGNOSIS — R531 Weakness: Secondary | ICD-10-CM | POA: Diagnosis present

## 2016-01-29 DIAGNOSIS — E785 Hyperlipidemia, unspecified: Secondary | ICD-10-CM | POA: Diagnosis present

## 2016-01-29 DIAGNOSIS — I110 Hypertensive heart disease with heart failure: Secondary | ICD-10-CM | POA: Diagnosis present

## 2016-01-29 DIAGNOSIS — G936 Cerebral edema: Secondary | ICD-10-CM | POA: Diagnosis present

## 2016-01-29 DIAGNOSIS — R269 Unspecified abnormalities of gait and mobility: Secondary | ICD-10-CM

## 2016-01-29 DIAGNOSIS — I6789 Other cerebrovascular disease: Secondary | ICD-10-CM | POA: Diagnosis not present

## 2016-01-29 DIAGNOSIS — Z951 Presence of aortocoronary bypass graft: Secondary | ICD-10-CM

## 2016-01-29 DIAGNOSIS — Z8249 Family history of ischemic heart disease and other diseases of the circulatory system: Secondary | ICD-10-CM

## 2016-01-29 DIAGNOSIS — R001 Bradycardia, unspecified: Secondary | ICD-10-CM

## 2016-01-29 DIAGNOSIS — Z87891 Personal history of nicotine dependence: Secondary | ICD-10-CM

## 2016-01-29 DIAGNOSIS — I25810 Atherosclerosis of coronary artery bypass graft(s) without angina pectoris: Secondary | ICD-10-CM | POA: Diagnosis present

## 2016-01-29 DIAGNOSIS — I5032 Chronic diastolic (congestive) heart failure: Secondary | ICD-10-CM | POA: Diagnosis present

## 2016-01-29 DIAGNOSIS — Z7982 Long term (current) use of aspirin: Secondary | ICD-10-CM | POA: Diagnosis not present

## 2016-01-29 DIAGNOSIS — Z823 Family history of stroke: Secondary | ICD-10-CM

## 2016-01-29 DIAGNOSIS — I25118 Atherosclerotic heart disease of native coronary artery with other forms of angina pectoris: Secondary | ICD-10-CM | POA: Diagnosis present

## 2016-01-29 DIAGNOSIS — Z833 Family history of diabetes mellitus: Secondary | ICD-10-CM

## 2016-01-29 DIAGNOSIS — I639 Cerebral infarction, unspecified: Secondary | ICD-10-CM | POA: Diagnosis present

## 2016-01-29 DIAGNOSIS — E860 Dehydration: Secondary | ICD-10-CM | POA: Diagnosis present

## 2016-01-29 DIAGNOSIS — I10 Essential (primary) hypertension: Secondary | ICD-10-CM

## 2016-01-29 DIAGNOSIS — H409 Unspecified glaucoma: Secondary | ICD-10-CM

## 2016-01-29 DIAGNOSIS — I69311 Memory deficit following cerebral infarction: Secondary | ICD-10-CM

## 2016-01-29 DIAGNOSIS — I63441 Cerebral infarction due to embolism of right cerebellar artery: Secondary | ICD-10-CM | POA: Diagnosis present

## 2016-01-29 DIAGNOSIS — I2510 Atherosclerotic heart disease of native coronary artery without angina pectoris: Secondary | ICD-10-CM | POA: Diagnosis present

## 2016-01-29 DIAGNOSIS — I25709 Atherosclerosis of coronary artery bypass graft(s), unspecified, with unspecified angina pectoris: Secondary | ICD-10-CM | POA: Diagnosis not present

## 2016-01-29 DIAGNOSIS — D72829 Elevated white blood cell count, unspecified: Secondary | ICD-10-CM

## 2016-01-29 HISTORY — DX: Hyperlipidemia, unspecified: E78.5

## 2016-01-29 HISTORY — DX: Unspecified glaucoma: H40.9

## 2016-01-29 HISTORY — DX: Essential (primary) hypertension: I10

## 2016-01-29 HISTORY — DX: Atherosclerotic heart disease of native coronary artery without angina pectoris: I25.10

## 2016-01-29 LAB — URINALYSIS, ROUTINE W REFLEX MICROSCOPIC
BILIRUBIN URINE: NEGATIVE
GLUCOSE, UA: NEGATIVE mg/dL
Hgb urine dipstick: NEGATIVE
KETONES UR: NEGATIVE mg/dL
LEUKOCYTES UA: NEGATIVE
NITRITE: NEGATIVE
PROTEIN: NEGATIVE mg/dL
Specific Gravity, Urine: 1.025 (ref 1.005–1.030)
pH: 5.5 (ref 5.0–8.0)

## 2016-01-29 LAB — COMPREHENSIVE METABOLIC PANEL
ALK PHOS: 54 U/L (ref 38–126)
ALT: 17 U/L (ref 17–63)
AST: 21 U/L (ref 15–41)
Albumin: 4.1 g/dL (ref 3.5–5.0)
Anion gap: 9 (ref 5–15)
BILIRUBIN TOTAL: 1.2 mg/dL (ref 0.3–1.2)
BUN: 20 mg/dL (ref 6–20)
CO2: 29 mmol/L (ref 22–32)
CREATININE: 0.9 mg/dL (ref 0.61–1.24)
Calcium: 9.3 mg/dL (ref 8.9–10.3)
Chloride: 101 mmol/L (ref 101–111)
GFR calc Af Amer: >60 mL/min (ref >60)
GLUCOSE: 131 mg/dL — AB (ref 65–99)
Potassium: 4.4 mmol/L (ref 3.5–5.1)
Sodium: 139 mmol/L (ref 135–145)
TOTAL PROTEIN: 6.8 g/dL (ref 6.5–8.1)

## 2016-01-29 LAB — CBC
HCT: 42.7 % (ref 39.0–52.0)
Hemoglobin: 14.3 g/dL (ref 13.0–17.0)
MCH: 30.7 pg (ref 26.0–34.0)
MCHC: 33.5 g/dL (ref 30.0–36.0)
MCV: 91.6 fL (ref 78.0–100.0)
PLATELETS: 211 10*3/uL (ref 150–400)
RBC: 4.66 MIL/uL (ref 4.22–5.81)
RDW: 13.5 % (ref 11.5–15.5)
WBC: 12.2 10*3/uL — AB (ref 4.0–10.5)

## 2016-01-29 LAB — LIPASE, BLOOD: Lipase: 20 U/L (ref 11–51)

## 2016-01-29 MED ORDER — BRIMONIDINE TARTRATE 0.2 % OP SOLN
1.0000 [drp] | Freq: Two times a day (BID) | OPHTHALMIC | Status: DC
  Administered 2016-01-29 – 2016-02-02 (×8): 1 [drp] via OPHTHALMIC
  Filled 2016-01-29: qty 5

## 2016-01-29 MED ORDER — ASPIRIN 300 MG RE SUPP
300.0000 mg | Freq: Every day | RECTAL | Status: DC
Start: 2016-01-29 — End: 2016-01-30

## 2016-01-29 MED ORDER — SIMVASTATIN 20 MG PO TABS
20.0000 mg | ORAL_TABLET | Freq: Every day | ORAL | Status: DC
  Administered 2016-01-29 – 2016-02-02 (×5): 20 mg via ORAL
  Filled 2016-01-29 (×5): qty 1

## 2016-01-29 MED ORDER — LATANOPROST 0.005 % OP SOLN
1.0000 [drp] | Freq: Every day | OPHTHALMIC | Status: DC
  Administered 2016-01-29 – 2016-02-02 (×4): 1 [drp] via OPHTHALMIC
  Filled 2016-01-29: qty 2.5

## 2016-01-29 MED ORDER — LORATADINE 10 MG PO TABS: 10.0000 mg | ORAL_TABLET | Freq: Every day | ORAL | Status: DC | PRN

## 2016-01-29 MED ORDER — ENOXAPARIN SODIUM 40 MG/0.4ML ~~LOC~~ SOLN
40.0000 mg | SUBCUTANEOUS | Status: DC
  Administered 2016-01-29 – 2016-02-02 (×4): 40 mg via SUBCUTANEOUS
  Filled 2016-01-29 (×4): qty 0.4

## 2016-01-29 MED ORDER — BRIMONIDINE TARTRATE-TIMOLOL 0.2-0.5 % OP SOLN: 1.0000 [drp] | Freq: Two times a day (BID) | OPHTHALMIC | Status: DC

## 2016-01-29 MED ORDER — DOCUSATE SODIUM 100 MG PO CAPS
100.0000 mg | ORAL_CAPSULE | Freq: Two times a day (BID) | ORAL | Status: DC
  Administered 2016-01-29 – 2016-02-02 (×8): 100 mg via ORAL
  Filled 2016-01-29 (×8): qty 1

## 2016-01-29 MED ORDER — PROMETHAZINE HCL 25 MG/ML IJ SOLN: 12.5000 mg | Freq: Four times a day (QID) | INTRAMUSCULAR | Status: DC | PRN

## 2016-01-29 MED ORDER — TIMOLOL MALEATE 0.5 % OP SOLN
1.0000 [drp] | Freq: Two times a day (BID) | OPHTHALMIC | Status: DC
  Administered 2016-01-29 – 2016-02-02 (×8): 1 [drp] via OPHTHALMIC
  Filled 2016-01-29: qty 5

## 2016-01-29 MED ORDER — LISINOPRIL 5 MG PO TABS
5.0000 mg | ORAL_TABLET | Freq: Every day | ORAL | Status: DC
  Administered 2016-01-29 – 2016-02-02 (×5): 5 mg via ORAL
  Filled 2016-01-29 (×5): qty 1

## 2016-01-29 MED ORDER — NITROGLYCERIN 0.4 MG SL SUBL: 0.4000 mg | SUBLINGUAL_TABLET | SUBLINGUAL | Status: DC | PRN

## 2016-01-29 MED ORDER — FINASTERIDE 5 MG PO TABS
5.0000 mg | ORAL_TABLET | Freq: Every day | ORAL | Status: DC
  Administered 2016-01-29 – 2016-02-02 (×5): 5 mg via ORAL
  Filled 2016-01-29 (×5): qty 1

## 2016-01-29 MED ORDER — VITAMIN D 1000 UNITS PO TABS
1000.0000 [IU] | ORAL_TABLET | Freq: Every day | ORAL | Status: DC
  Administered 2016-01-29 – 2016-02-02 (×5): 1000 [IU] via ORAL
  Filled 2016-01-29 (×5): qty 1

## 2016-01-29 MED ORDER — STROKE: EARLY STAGES OF RECOVERY BOOK
Freq: Once | Status: AC
  Administered 2016-01-29: 1

## 2016-01-29 MED ORDER — POTASSIUM CHLORIDE CRYS ER 10 MEQ PO TBCR
10.0000 meq | EXTENDED_RELEASE_TABLET | Freq: Every day | ORAL | Status: DC
  Administered 2016-01-29 – 2016-02-02 (×5): 10 meq via ORAL
  Filled 2016-01-29 (×5): qty 1

## 2016-01-29 MED ORDER — ASPIRIN 325 MG PO TABS
325.0000 mg | ORAL_TABLET | Freq: Every day | ORAL | Status: DC
  Administered 2016-01-29 – 2016-01-30 (×2): 325 mg via ORAL
  Filled 2016-01-29 (×2): qty 1

## 2016-01-29 MED ORDER — SODIUM CHLORIDE 0.9 % IV BOLUS (SEPSIS)
500.0000 mL | Freq: Once | INTRAVENOUS | Status: AC
  Administered 2016-01-29: 500 mL via INTRAVENOUS

## 2016-01-29 MED ORDER — ONDANSETRON HCL 4 MG/2ML IJ SOLN
4.0000 mg | Freq: Four times a day (QID) | INTRAMUSCULAR | Status: DC | PRN
  Administered 2016-01-29 – 2016-01-30 (×2): 4 mg via INTRAVENOUS
  Filled 2016-01-29 (×2): qty 2

## 2016-01-29 MED ORDER — SODIUM CHLORIDE 0.9 % IV SOLN
INTRAVENOUS | Status: DC
  Administered 2016-01-29: 1000 mL via INTRAVENOUS

## 2016-01-29 MED ORDER — HYDRALAZINE HCL 20 MG/ML IJ SOLN
10.0000 mg | INTRAMUSCULAR | Status: DC | PRN
  Administered 2016-02-01: 10 mg via INTRAVENOUS
  Filled 2016-01-29: qty 1

## 2016-01-29 MED ORDER — FUROSEMIDE 40 MG PO TABS
40.0000 mg | ORAL_TABLET | Freq: Every day | ORAL | Status: DC
  Administered 2016-01-29 – 2016-02-02 (×5): 40 mg via ORAL
  Filled 2016-01-29 (×5): qty 1

## 2016-01-29 MED ORDER — VITAMIN B-12 100 MCG PO TABS
100.0000 ug | ORAL_TABLET | Freq: Every day | ORAL | Status: DC
  Administered 2016-01-29 – 2016-02-02 (×5): 100 ug via ORAL
  Filled 2016-01-29 (×5): qty 1

## 2016-01-29 NOTE — H&P (Signed)
History and Physical    Bruce Clarke ZOX:[id number] DOB: [birth year] DOA: 01/29/2016  Referring MD/NP/PA: Juleen China PCP: No PCP Per Patient  Patient coming from: home, independent ADLs, drives  Chief Complaint: weakness  HPI: Bruce Clarke is a 82 y.o. male with history of CAD s/p CABG, hypertension, hyperlipidemia, and glaucoma, former Marine, who presents with nausea and weakness.  He developed mild lightheadedness and nausea two days prior to admission which became progressively worse.  He has been unable to keep food or drink down or take his medications.  He also noticed some ataxia without focal limb numbness or weakness.  He denies blurred vision, room spinning, poor coordination, confusion, slurred speech, difficulty speaking or facial droop.  He has not been able to feel the sensation to empty his bladder or defecate, but he has been able to void and have a BM since this started.  He denies recent illnesses.  He came to the ER because of difficulty walking and persistent severe nausea via EMS.    ED Course:  VS notable for hypertension in the 150-160s, HR in the 50-60, breathing comfortably on room air.  MRI brain demonstrated an acute large right cerebellar PICA territory infarct with punctate acute infarct versus artifact in the pons.  Neurology recommended admission to [facility] for ongoing work up and management by stroke team.    Review of Systems:  General:  Denies fevers, chills, weight loss or gain HEENT:  Denies changes to hearing and vision, rhinorrhea, sinus congestion, sore throat.  Feels thirsty CV:  Denies chest pain and palpitations, lower extremity edema.  PULM:  Denies SOB, wheezing, cough.   GI:  Denies nausea, vomiting, constipation, diarrhea.   GU:  Denies dysuria, frequency, urgency.  Cannot feel to urinate very well. ENDO:  Denies polyuria, polydipsia.   HEME:  Denies hematemesis, blood in stools, melena, abnormal bruising or bleeding.  LYMPH:  Denies  lymphadenopathy.   MSK:  Denies arthralgias, myalgias.   DERM:  Denies skin rash or ulcer.   NEURO:  Per HPI PSYCH:  Denies anxiety and depression.    Past Medical History:  Diagnosis Date  . Coronary artery disease   . Glaucoma   . Hyperlipidemia   . Hypertension     Past Surgical History:  Procedure Laterality Date  . CORONARY ARTERY BYPASS GRAFT     3-vessel around 1990.  Followed at [facility]     reports that he has quit smoking. He has never used smokeless tobacco. He reports that he does not drink alcohol or use drugs.  Allergies  Allergen Reactions  . Ibuprofen Swelling    Family History  Problem Relation Age of Onset  . CAD Mother   . Diabetes Mother   . Eczema Mother   . CAD Father     lived to 39 yo  . Stroke Father     Prior to Admission medications   Medication Sig Start Date End Date Taking? Authorizing Provider  aspirin EC 81 MG tablet Take 81 mg by mouth daily.   Yes Historical Provider, MD  bimatoprost (LUMIGAN) 0.01 % SOLN Place 1 drop into both eyes at bedtime. 11/05/13  Yes Historical Provider, MD  brimonidine-timolol (COMBIGAN) 0.2-0.5 % ophthalmic solution Place 1 drop into both eyes every 12 (twelve) hours.   Yes Historical Provider, MD  Cholecalciferol (VITAMIN D-1000 MAX ST) 1000 units tablet Take 1 tablet by mouth daily.   Yes Historical Provider, MD  docusate sodium (STOOL SOFTENER) 100 MG capsule Take  100 mg by mouth 2 (two) times daily. 07/10/10  Yes Historical Provider, MD  finasteride (PROSCAR) 5 MG tablet Take 5 mg by mouth daily. 08/03/10  Yes Historical Provider, MD  furosemide (LASIX) 40 MG tablet Take 1 tablet by mouth daily. 12/05/15  Yes Historical Provider, MD  ketoconazole (NIZORAL) 2 % cream Apply 1 application topically as directed. 11/05/14  Yes Historical Provider, MD  lisinopril (PRINIVIL,ZESTRIL) 5 MG tablet Take 1 tablet by mouth daily. 01/04/16  Yes Historical Provider, MD  loratadine (CLARITIN) 10 MG tablet Take 10 mg by mouth daily  as needed for allergies.   Yes Historical Provider, MD  nitroGLYCERIN (NITROSTAT) 0.4 MG SL tablet Take 1 tablet by mouth as directed. 01/05/15  Yes Historical Provider, MD  potassium chloride (MICRO-K) 10 MEQ CR capsule Take 1 capsule by mouth daily. 09/05/15  Yes Historical Provider, MD  simvastatin (ZOCOR) 20 MG tablet Take 1 tablet by mouth daily. 06/07/14  Yes Historical Provider, MD  vitamin B-12 (CYANOCOBALAMIN) 100 MCG tablet Take 100 mcg by mouth daily.   Yes Historical Provider, MD    Physical Exam: Vitals:   01/29/16 1630 01/29/16 1757 01/29/16 1800 01/29/16 1851  BP: 168/59 154/57 (!) 162/52   Pulse: 60 73 61   Resp:  17 18   Temp:      TempSrc:      SpO2: 100% 99% 95%   Weight:    77.1 kg (170 lb)  Height:    5\' 8"  (1.727 m)      Constitutional: NAD, calm, comfortable, wearing glasses Eyes: PERRL, lids normal, eyes injected ENMT:  Dry MM. Posterior pharynx clear of any exudate or lesions.  Normal dentition.  Neck: normal, supple, no masses, no thyromegaly Respiratory: clear to auscultation bilaterally, no wheezing, no crackles. Normal respiratory effort. No accessory muscle use.  Cardiovascular: Regular rate and rhythm, no murmurs / rubs / gallops. No extremity edema. 2+ pedal pulses. No carotid bruits.  Abdomen: no tenderness, no masses palpated. No hepatosplenomegaly. Bowel sounds positive.  Musculoskeletal: no clubbing / cyanosis. No joint deformity upper and lower extremities. Good ROM, no contractures. Normal muscle tone.  Skin: no rashes, lesions, ulcers. No induration Neurologic: CN 2-12 grossly intact. Sensation intact arms and legs,  Strength 5/5 in all 4.  Does not move his left arm as spontaneously as he moves his right, but no obvious dysmetria of arms or legs.  Did not observe gait because of trunk ataxia requiring 2 person assistance Psychiatric: Normal judgment and insight. Alert and oriented x 3. Normal mood.  Mild confusion regarding timing of events.  No  speech deficits or aphasia.    Labs on Admission: I have personally reviewed following labs and imaging studies  CBC:  Recent Labs Lab 01/29/16 0954  WBC 12.2*  HGB 14.3  HCT 42.7  MCV 91.6  PLT 211   Basic Metabolic Panel:  Recent Labs Lab 01/29/16 0954  NA 139  K 4.4  CL 101  CO2 29  GLUCOSE 131*  BUN 20  CREATININE 0.90  CALCIUM 9.3   GFR: Estimated Creatinine Clearance: 46.4 mL/min (by C-G formula based on SCr of 0.9 mg/dL). Liver Function Tests:  Recent Labs Lab 01/29/16 0954  AST 21  ALT 17  ALKPHOS 54  BILITOT 1.2  PROT 6.8  ALBUMIN 4.1    Recent Labs Lab 01/29/16 0954  LIPASE 20   No results for input(s): AMMONIA in the last 168 hours. Coagulation Profile: No results for input(s): INR, PROTIME in  the last 168 hours. Cardiac Enzymes: No results for input(s): CKTOTAL, CKMB, CKMBINDEX, TROPONINI in the last 168 hours. BNP (last 3 results) No results for input(s): PROBNP in the last 8760 hours. HbA1C: No results for input(s): HGBA1C in the last 72 hours. CBG: No results for input(s): GLUCAP in the last 168 hours. Lipid Profile: No results for input(s): CHOL, HDL, LDLCALC, TRIG, CHOLHDL, LDLDIRECT in the last 72 hours. Thyroid Function Tests: No results for input(s): TSH, T4TOTAL, FREET4, T3FREE, THYROIDAB in the last 72 hours. Anemia Panel: No results for input(s): VITAMINB12, FOLATE, FERRITIN, TIBC, IRON, RETICCTPCT in the last 72 hours. Urine analysis:    Component Value Date/Time   COLORURINE YELLOW 01/29/2016 0954   APPEARANCEUR CLEAR 01/29/2016 0954   LABSPEC 1.025 01/29/2016 0954   PHURINE 5.5 01/29/2016 0954   GLUCOSEU NEGATIVE 01/29/2016 0954   HGBUR NEGATIVE 01/29/2016 0954   BILIRUBINUR NEGATIVE 01/29/2016 0954   KETONESUR NEGATIVE 01/29/2016 0954   PROTEINUR NEGATIVE 01/29/2016 0954   UROBILINOGEN 1.0 05/19/2010 2127   NITRITE NEGATIVE 01/29/2016 0954   LEUKOCYTESUR NEGATIVE 01/29/2016 0954   Sepsis  Labs: @LABRCNTIP (procalcitonin:4,lacticidven:4) )No results found for this or any previous visit (from the past 240 hour(s)).   Radiological Exams on Admission: Dg Chest 2 View  Result Date: 01/29/2016 CLINICAL DATA:  Weakness EXAM: CHEST  2 VIEW COMPARISON:  05/19/2010 chest radiograph. FINDINGS: Sternotomy wires appear aligned and intact. Stable cardiomediastinal silhouette with top-normal heart size and aortic atherosclerosis. No pneumothorax. No pleural effusion. No pulmonary edema. No acute consolidative airspace disease. IMPRESSION: No active cardiopulmonary disease. Aortic atherosclerosis. Electronically Signed   By: Delbert Phenix M.D.   On: 01/29/2016 12:51   Mr Brain Wo Contrast  Result Date: 01/29/2016 CLINICAL DATA:  Dizziness with gait disturbance. Nausea, vomiting, and weakness for 2 days. EXAM: MRI HEAD WITHOUT CONTRAST TECHNIQUE: Multiplanar, multiecho pulse sequences of the brain and surrounding structures were obtained without intravenous contrast. COMPARISON:  05/23/2010 FINDINGS: Brain: There is a large acute right cerebellar PICA territory infarct. There is associated cytotoxic edema without significant posterior fossa mass effect or evidence of associated hemorrhage. There is a punctate acute infarct versus artifact in the right lower pons, not confirmed on coronal imaging. Partially empty sella is unchanged. Chronic microhemorrhages are noted in posterior right frontal cortex and left centrum semiovale. No mass, midline shift, or extra-axial fluid collection is seen. There is age-appropriate cerebral atrophy. Deep cerebral white matter T2 hyperintensities have mildly progressed from 2012 and are nonspecific but compatible with mild chronic small vessel ischemic disease. Vascular: Major intracranial vascular flow voids are preserved. Skull and upper cervical spine: No suspicious osseous lesion. Sinuses/Orbits: Prior bilateral cataract extraction.  Clear sinuses. Other: None.  IMPRESSION: 1. Acute large right cerebellar PICA territory infarct. 2. Punctate acute infarct versus artifact in the pons. 3. Mild chronic small vessel ischemic disease. Electronically Signed   By: Sebastian Ache M.D.   On: 01/29/2016 15:45    EKG: Independently reviewed. NSR with LBBB, no prior for comparison  Assessment/Plan Principal Problem:   Cerebellar stroke (HCC) Active Problems:   Stroke (cerebrum) (HCC)   Dehydration   Essential hypertension   Hyperlipidemia   CAD (coronary artery disease) of bypass graft   Glaucoma     Acute cerebellar stroke with residual trunkal ataxia and nausea -  Telemetry  -  Initially q2h neuro checks, then q4h neuro checks -  MRA head -  Carotid duplex -  ECHO -  Aspirin 325mg  daily -  Lipid panel    -  A1c -  PT/OT/SLP  -  Neurology assistance appreciated, Stroke team to follow -  zofran and phenergan prn  Dehydration -  IVF overnight  Hypertension, blood pressures moderately elevated 2 days since symptom onset -  Continue lisinopril  Hyperlipidemia -  Lipid panel -  Simvastatin  CAD with hx of CABG, LBBB, stable angina -  Continue aspirin, statin, and ACEI.  Not on beta blocker due to bradycardia -  Continue NTG  Chronic diastolic heart failure, appears euvolemic -  Continue lasix 40mg  daily  Glaucoma, stable, -  Continue bimatoprost annd combigan  DVT prophylaxis: lovenox  Code Status: partial code:  If heart stops beating, no CP, shocks, medications, but would want ICU, ventilator, vasopressors, and medications for arrhythmias.   Family Communication: patient and his daughter, [name] [facility]  Consults called: Neurology  Admission status: Admission   Renae FickleSHORT, Francenia Chimenti MD [facility] Pager [phone]  If 7PM-7AM, please contact night-coverage [email or web address] Password [email or web address]  01/29/2016, 7:03 PM

## 2016-01-29 NOTE — ED Notes (Signed)
Bed: [id number] Expected date:  Expected time:  Means of arrival:  Comments: EMS 89 yoM weakness

## 2016-01-29 NOTE — ED Notes (Signed)
[facility] ARRIVED TO TRANSPORT PT TO [facility] 54M-01. AAOX4. PT IN NO APPARENT DISTRESS OR PAIN. THE OPPORTUNITY TO ASK QUESTIONS WAS PROVIDED.

## 2016-01-29 NOTE — ED Notes (Signed)
Pt ambulated from bed to hall. Pt was very unsteady and needed 2 person assist.

## 2016-01-29 NOTE — ED Triage Notes (Signed)
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

## 2016-01-29 NOTE — Progress Notes (Signed)
[facility] alert and oriented strength good but issues with gait, no pain, will continue to monitor.

## 2016-01-29 NOTE — ED Provider Notes (Signed)
[facility]-EMERGENCY DEPT Provider Note   CSN: [id number] Arrival date & time: 01/29/16  1478     History   Chief Complaint Chief Complaint  Patient presents with  . Weakness  . Emesis    HPI Bruce Clarke is a 82 y.o. male.  He presents for ongoing nausea and vomiting, with weakness, then tamped walking, for 3 days time. Last vomiting 6:30 AM today. He is visiting, [place], staying with family members. No prior similar problem. He is normally very active, exercising several days each week, driving and maintaining his personal affairs, living alone. He denies fever, chills, cough, chest pain, back pain or abdominal pain. He is taking his usual medications. There are no other known modifying factors.  HPI  Past Medical History:  Diagnosis Date  . Coronary artery disease   . Hyperlipidemia   . Hypertension     There are no active problems to display for this patient.   Past Surgical History:  Procedure Laterality Date  . CARDIAC SURGERY         Home Medications    Prior to Admission medications   Medication Sig Start Date End Date Taking? Authorizing Provider  aspirin EC 81 MG tablet Take 81 mg by mouth daily.   Yes Historical Provider, MD  bimatoprost (LUMIGAN) 0.01 % SOLN Place 1 drop into both eyes at bedtime. 11/05/13  Yes Historical Provider, MD  brimonidine-timolol (COMBIGAN) 0.2-0.5 % ophthalmic solution Place 1 drop into both eyes every 12 (twelve) hours.   Yes Historical Provider, MD  Cholecalciferol (VITAMIN D-1000 MAX ST) 1000 units tablet Take 1 tablet by mouth daily.   Yes Historical Provider, MD  docusate sodium (STOOL SOFTENER) 100 MG capsule Take 100 mg by mouth 2 (two) times daily. 07/10/10  Yes Historical Provider, MD  finasteride (PROSCAR) 5 MG tablet Take 5 mg by mouth daily. 08/03/10  Yes Historical Provider, MD  furosemide (LASIX) 40 MG tablet Take 1 tablet by mouth daily. 12/05/15  Yes Historical Provider, MD  ketoconazole (NIZORAL) 2 % cream Apply 1  application topically as directed. 11/05/14  Yes Historical Provider, MD  lisinopril (PRINIVIL,ZESTRIL) 5 MG tablet Take 1 tablet by mouth daily. 01/04/16  Yes Historical Provider, MD  loratadine (CLARITIN) 10 MG tablet Take 10 mg by mouth daily as needed for allergies.   Yes Historical Provider, MD  nitroGLYCERIN (NITROSTAT) 0.4 MG SL tablet Take 1 tablet by mouth as directed. 01/05/15  Yes Historical Provider, MD  potassium chloride (MICRO-K) 10 MEQ CR capsule Take 1 capsule by mouth daily. 09/05/15  Yes Historical Provider, MD  simvastatin (ZOCOR) 20 MG tablet Take 1 tablet by mouth daily. 06/07/14  Yes Historical Provider, MD  vitamin B-12 (CYANOCOBALAMIN) 100 MCG tablet Take 100 mcg by mouth daily.   Yes Historical Provider, MD    Family History No family history on file.  Social History Social History  Substance Use Topics  . Smoking status: Former Games developer  . Smokeless tobacco: Never Used  . Alcohol use No     Allergies   Ibuprofen   Review of Systems Review of Systems  All other systems reviewed and are negative.    Physical Exam Updated Vital Signs BP (!) 150/49   Pulse (!) 53   Temp 98.2 F (36.8 C)   Resp 14   SpO2 96%   Physical Exam  Constitutional: He is oriented to person, place, and time. He appears well-developed and well-nourished.  HENT:  Head: Normocephalic and atraumatic.  Right Ear: External ear  normal.  Left Ear: External ear normal.  Eyes: Conjunctivae and EOM are normal. Pupils are equal, round, and reactive to light.  Neck: Normal range of motion and phonation normal. Neck supple.  Cardiovascular: Normal rate, regular rhythm and normal heart sounds.   Pulmonary/Chest: Effort normal and breath sounds normal. He exhibits no bony tenderness.  Abdominal: Soft. There is no tenderness.  Musculoskeletal: Normal range of motion.  Neurological: He is alert and oriented to person, place, and time. No cranial nerve deficit or sensory deficit. He exhibits  normal muscle tone. Coordination normal.  No dysarthria, aphasia or nystagmus. No pronator drift. Normal heel-to-shin, bilaterally. No dysmetria.  Skin: Skin is warm, dry and intact.  Psychiatric: He has a normal mood and affect. His behavior is normal. Judgment and thought content normal.  Nursing note and vitals reviewed.    ED Treatments / Results  Labs (all labs ordered are listed, but only abnormal results are displayed) Labs Reviewed  COMPREHENSIVE METABOLIC PANEL - Abnormal; Notable for the following:       Result Value   Glucose, Bld 131 (*)    All other components within normal limits  CBC - Abnormal; Notable for the following:    WBC 12.2 (*)    All other components within normal limits  LIPASE, BLOOD  URINALYSIS, ROUTINE W REFLEX MICROSCOPIC (NOT AT [facility])    EKG  EKG Interpretation  Date/Time:  Sunday January 29 2016 09:54:36 EDT Ventricular Rate:  59 PR Interval:    QRS Duration: 156 QT Interval:  503 QTC Calculation: 499 R Axis:   -55 Text Interpretation:  Sinus rhythm Borderline prolonged PR interval Left bundle branch block since last tracing no significant change Confirmed by Effie ShyWENTZ  MD, Chole Driver [phone]([id number]) on 01/29/2016 11:41:25 AM       Radiology Dg Chest 2 View  Result Date: 01/29/2016 CLINICAL DATA:  Weakness EXAM: CHEST  2 VIEW COMPARISON:  05/19/2010 chest radiograph. FINDINGS: Sternotomy wires appear aligned and intact. Stable cardiomediastinal silhouette with top-normal heart size and aortic atherosclerosis. No pneumothorax. No pleural effusion. No pulmonary edema. No acute consolidative airspace disease. IMPRESSION: No active cardiopulmonary disease. Aortic atherosclerosis. Electronically Signed   By: Delbert PhenixJason A Poff M.D.   On: 01/29/2016 12:51    Procedures Procedures (including critical care time)  Medications Ordered in ED Medications  sodium chloride 0.9 % bolus 500 mL (500 mLs Intravenous New Bag/Given 01/29/16 1219)     Initial Impression /  Assessment and Plan / ED Course  I have reviewed the triage vital signs and the nursing notes.  Pertinent labs & imaging results that were available during my care of the patient were reviewed by me and considered in my medical decision making (see chart for details).  Clinical Course   Medications  sodium chloride 0.9 % bolus 500 mL (500 mLs Intravenous New Bag/Given 01/29/16 1219)    Patient Vitals for the past 24 hrs:  BP Temp Temp src Pulse Resp SpO2  01/29/16 1355 - 98.2 F (36.8 C) - - - -  01/29/16 1331 (!) 150/49 - - (!) 53 14 96 %  01/29/16 1132 (!) 153/52 - - (!) 56 18 99 %  01/29/16 0953 152/55 98 F (36.7 C) Oral (!) 59 18 98 %   13:35- he's been able to tolerate liquids without vomiting. He states he feels better. Attempt at ambulation, he required first one, then two person assistance, within 15 feet, then needed help to get back to the bed. MRI ordered  3:34 PM Reevaluation with update and discussion. After initial assessment and treatment, an updated evaluation reveals clinical exam unchanged. Patient family updated on findings, and plan. Aquita Simmering L    15:55- Page to Neuro Hospitalist- will see as a Research scientist (medical), at [facility] [phone]-  Dr. Juleen China will talk to them and arrange admission (16:50).  16:20- findings and plan discussed with patient and daughter, all questions answered  Final Clinical Impressions(s) / ED Diagnoses   Final diagnoses:  Cerebrovascular accident (CVA), unspecified mechanism (HCC)    Dizziness with difficulty walking with abnormal MRI consistent with CVA. He will require admission for further evaluation treatment.   Nursing Notes Reviewed/ Care Coordinated, and agree without changes. Applicable Imaging Reviewed.  Interpretation of Laboratory Data incorporated into ED treatment  Plan: Admit   New Prescriptions New Prescriptions   No medications on file     Mancel Bale, MD 01/30/16 [phone]

## 2016-01-29 NOTE — ED Triage Notes (Signed)
PO Challenge given. 

## 2016-01-29 NOTE — ED Triage Notes (Signed)
Pt in MRI.

## 2016-01-29 NOTE — ED Triage Notes (Signed)
Pt aware we are waiting for MRI

## 2016-01-29 NOTE — Consult Note (Signed)
Admission H&P    Chief Complaint: Lightheadedness and unstable gait.  HPI: Bruce Clarke is an 82 y.o. male history of hypertension, hyperlipidemia, coronary artery disease and glaucoma who presented to the ED at [facility] with new onset stable gait and lightheadedness. He was last known well on 01/28/2016. Is no previous history of stroke nor TIA. He's been taking aspirin daily. RI of his brain showed an acute right PICA territory or bowel infarction as well as an equivocal acute pontine infarction. NIH stroke score the time of this evaluation was 0. Patient has had no change in speech or swallowing. No weakness of extremities normal coordination abnormality is been noted.  LSN: 01/28/2016 tPA Given: No: No clear deficits in a: Beyond time window for treatment consideration mRankin:  Past Medical History:  Diagnosis Date  . Coronary artery disease   . Glaucoma   . Hyperlipidemia   . Hypertension     Past Surgical History:  Procedure Laterality Date  . CORONARY ARTERY BYPASS GRAFT     3-vessel around 1990.  Followed at [facility] History  Problem Relation Age of Onset  . CAD Mother   . Diabetes Mother   . Eczema Mother   . CAD Father     lived to 25 yo  . Stroke Father    Social History:  reports that he has quit smoking. He has never used smokeless tobacco. He reports that he does not drink alcohol or use drugs.  Allergies:  Allergies  Allergen Reactions  . Ibuprofen Swelling    Medications Prior to Admission  Medication Sig Dispense Refill  . aspirin EC 81 MG tablet Take 81 mg by mouth daily.    . bimatoprost (LUMIGAN) 0.01 % SOLN Place 1 drop into both eyes at bedtime.    . brimonidine-timolol (COMBIGAN) 0.2-0.5 % ophthalmic solution Place 1 drop into both eyes every 12 (twelve) hours.    . Cholecalciferol (VITAMIN D-1000 MAX ST) 1000 units tablet Take 1 tablet by mouth daily.    Marland Kitchen docusate sodium (STOOL SOFTENER) 100 MG capsule Take 100 mg by mouth  2 (two) times daily.    . finasteride (PROSCAR) 5 MG tablet Take 5 mg by mouth daily.    . furosemide (LASIX) 40 MG tablet Take 1 tablet by mouth daily.    Marland Kitchen ketoconazole (NIZORAL) 2 % cream Apply 1 application topically as directed.    Marland Kitchen lisinopril (PRINIVIL,ZESTRIL) 5 MG tablet Take 1 tablet by mouth daily.    Marland Kitchen loratadine (CLARITIN) 10 MG tablet Take 10 mg by mouth daily as needed for allergies.    . nitroGLYCERIN (NITROSTAT) 0.4 MG SL tablet Take 1 tablet by mouth as directed.    . potassium chloride (MICRO-K) 10 MEQ CR capsule Take 1 capsule by mouth daily.    . simvastatin (ZOCOR) 20 MG tablet Take 1 tablet by mouth daily.    . vitamin B-12 (CYANOCOBALAMIN) 100 MCG tablet Take 100 mcg by mouth daily.      ROS: History obtained from the patient and chart review  General ROS: negative for - chills, fatigue, fever, night sweats, weight gain or weight loss Psychological ROS: negative for - behavioral disorder, hallucinations, memory difficulties, mood swings or suicidal ideation Ophthalmic ROS: negative for - blurry vision, double vision, eye pain or loss of vision ENT ROS: negative for - epistaxis, nasal discharge, oral lesions, sore throat, tinnitus or vertigo Allergy and Immunology ROS: negative for - hives or itchy/watery eyes Hematological and  Lymphatic ROS: negative for - bleeding problems, bruising or swollen lymph nodes Endocrine ROS: negative for - galactorrhea, hair pattern changes, polydipsia/polyuria or temperature intolerance Respiratory ROS: negative for - cough, hemoptysis, shortness of breath or wheezing Cardiovascular ROS: negative for - chest pain, dyspnea on exertion, edema or irregular heartbeat Gastrointestinal ROS: negative for - abdominal pain, diarrhea, hematemesis, nausea/vomiting or stool incontinence Genito-Urinary ROS: Reduced sensation of urge for urination and defecation Musculoskeletal ROS: negative for - joint swelling or muscular weakness Neurological  ROS: as noted in HPI Dermatological ROS: negative for rash and skin lesion changes  Physical Examination: Blood pressure (!) 160/62, pulse (!) 58, temperature 97.2 F (36.2 C), temperature source Oral, resp. rate 15, height 5' 8"  (1.727 m), weight 76.1 kg (167 lb 12.3 oz), SpO2 94 %.  HEENT-  Normocephalic, no lesions, without obvious abnormality.  Normal external eye and conjunctiva.  Normal TM's bilaterally.  Normal auditory canals and external ears. Normal external nose, mucus membranes and septum.  Normal pharynx. Neck supple with no masses, nodes, nodules or enlargement. Cardiovascular - regular rate and rhythm, S1, S2 normal, no murmur, click, rub or gallop Lungs - chest clear, no wheezing, rales, normal symmetric air entry Abdomen - soft, non-tender; bowel sounds normal; no masses,  no organomegaly Extremities - no joint deformities, effusion, or inflammation and no edema  Neurologic Examination: Mental Status: Alert, oriented, thought content appropriate.  Speech fluent without evidence of aphasia. Able to follow commands without difficulty. Cranial Nerves: II-Visual fields were normal. III/IV/VI-Pupils were equal and reacted normally to light. Extraocular movements were full and conjugate.    V/VII-no facial numbness and no facial weakness. VIII-moderate hearing loss. X-normal speech and symmetrical palatal movement. XI: trapezius strength/neck flexion strength normal bilaterally XII-midline tongue extension with normal strength. Motor: 5/5 bilaterally with normal tone and bulk Sensory: Normal throughout. Deep Tendon Reflexes: 1+ and symmetric. Plantars: Mute bilaterally Cerebellar: Normal finger-to-nose testing. Carotid auscultation: Normal  Results for orders placed or performed during the hospital encounter of 01/29/16 (from the past 48 hour(s))  Lipase, blood     Status: None   Collection Time: 01/29/16  9:54 AM  Result Value Ref Range   Lipase 20 11 - 51 U/L   Comprehensive metabolic panel     Status: Abnormal   Collection Time: 01/29/16  9:54 AM  Result Value Ref Range   Sodium 139 135 - 145 mmol/L   Potassium 4.4 3.5 - 5.1 mmol/L   Chloride 101 101 - 111 mmol/L   CO2 29 22 - 32 mmol/L   Glucose, Bld 131 (H) 65 - 99 mg/dL   BUN 20 6 - 20 mg/dL   Creatinine, Ser 0.90 0.61 - 1.24 mg/dL   Calcium 9.3 8.9 - 10.3 mg/dL   Total Protein 6.8 6.5 - 8.1 g/dL   Albumin 4.1 3.5 - 5.0 g/dL   AST 21 15 - 41 U/L   ALT 17 17 - 63 U/L   Alkaline Phosphatase 54 38 - 126 U/L   Total Bilirubin 1.2 0.3 - 1.2 mg/dL   GFR calc non Af Amer >60 >60 mL/min   GFR calc Af Amer >60 >60 mL/min    Comment: (NOTE) The eGFR has been calculated using the CKD EPI equation. This calculation has not been validated in all clinical situations. eGFR's persistently <60 mL/min signify possible Chronic Kidney Disease.    Anion gap 9 5 - 15  CBC     Status: Abnormal   Collection Time: 01/29/16  9:54 AM  Result Value Ref Range   WBC 12.2 (H) 4.0 - 10.5 K/uL   RBC 4.66 4.22 - 5.81 MIL/uL   Hemoglobin 14.3 13.0 - 17.0 g/dL   HCT 42.7 39.0 - 52.0 %   MCV 91.6 78.0 - 100.0 fL   MCH 30.7 26.0 - 34.0 pg   MCHC 33.5 30.0 - 36.0 g/dL   RDW 13.5 11.5 - 15.5 %   Platelets 211 150 - 400 K/uL  Urinalysis, Routine w reflex microscopic     Status: None   Collection Time: 01/29/16  9:54 AM  Result Value Ref Range   Color, Urine YELLOW YELLOW   APPearance CLEAR CLEAR   Specific Gravity, Urine 1.025 1.005 - 1.030   pH 5.5 5.0 - 8.0   Glucose, UA NEGATIVE NEGATIVE mg/dL   Hgb urine dipstick NEGATIVE NEGATIVE   Bilirubin Urine NEGATIVE NEGATIVE   Ketones, ur NEGATIVE NEGATIVE mg/dL   Protein, ur NEGATIVE NEGATIVE mg/dL   Nitrite NEGATIVE NEGATIVE   Leukocytes, UA NEGATIVE NEGATIVE    Comment: MICROSCOPIC NOT DONE ON URINES WITH NEGATIVE PROTEIN, BLOOD, LEUKOCYTES, NITRITE, OR GLUCOSE <1000 mg/dL.   Dg Chest 2 View  Result Date: 01/29/2016 CLINICAL DATA:  Weakness EXAM: CHEST   2 VIEW COMPARISON:  05/19/2010 chest radiograph. FINDINGS: Sternotomy wires appear aligned and intact. Stable cardiomediastinal silhouette with top-normal heart size and aortic atherosclerosis. No pneumothorax. No pleural effusion. No pulmonary edema. No acute consolidative airspace disease. IMPRESSION: No active cardiopulmonary disease. Aortic atherosclerosis. Electronically Signed   By: Ilona Sorrel M.D.   On: 01/29/2016 12:51   Mr Brain Wo Contrast  Result Date: 01/29/2016 CLINICAL DATA:  Dizziness with gait disturbance. Nausea, vomiting, and weakness for 2 days. EXAM: MRI HEAD WITHOUT CONTRAST TECHNIQUE: Multiplanar, multiecho pulse sequences of the brain and surrounding structures were obtained without intravenous contrast. COMPARISON:  05/23/2010 FINDINGS: Brain: There is a large acute right cerebellar PICA territory infarct. There is associated cytotoxic edema without significant posterior fossa mass effect or evidence of associated hemorrhage. There is a punctate acute infarct versus artifact in the right lower pons, not confirmed on coronal imaging. Partially empty sella is unchanged. Chronic microhemorrhages are noted in posterior right frontal cortex and left centrum semiovale. No mass, midline shift, or extra-axial fluid collection is seen. There is age-appropriate cerebral atrophy. Deep cerebral white matter T2 hyperintensities have mildly progressed from 2012 and are nonspecific but compatible with mild chronic small vessel ischemic disease. Vascular: Major intracranial vascular flow voids are preserved. Skull and upper cervical spine: No suspicious osseous lesion. Sinuses/Orbits: Prior bilateral cataract extraction.  Clear sinuses. Other: None. IMPRESSION: 1. Acute large right cerebellar PICA territory infarct. 2. Punctate acute infarct versus artifact in the pons. 3. Mild chronic small vessel ischemic disease. Electronically Signed   By: Logan Bores M.D.   On: 01/29/2016 15:45    Assessment:  82 y.o. male with hypertension and hyperlipidemia presenting with acute right PICA distribution ischemic infarction as well as possible acute pontine infarction (versus artifact).  Stroke Risk Factors - hyperlipidemia and hypertension  Plan: 1. HgbA1c, fasting lipid panel 2. MRA  of the brain without contrast 3. PT consult, OT consult, Speech consult 4. Echocardiogram 5. Carotid dopplers 6. Prophylactic therapy-Antiplatelet med: Aspirin  7. Risk factor modification 8. Telemetry monitoring  C.R. Nicole Kindred, MD [facility] [phone]  01/29/2016, 10:33 PM

## 2016-01-29 NOTE — ED Triage Notes (Signed)
Tolerated po challenge well. 

## 2016-01-30 ENCOUNTER — Inpatient Hospital Stay (HOSPITAL_COMMUNITY): Payer: Medicare Other

## 2016-01-30 DIAGNOSIS — I69311 Memory deficit following cerebral infarction: Secondary | ICD-10-CM

## 2016-01-30 DIAGNOSIS — H409 Unspecified glaucoma: Secondary | ICD-10-CM

## 2016-01-30 DIAGNOSIS — D72829 Elevated white blood cell count, unspecified: Secondary | ICD-10-CM

## 2016-01-30 DIAGNOSIS — R001 Bradycardia, unspecified: Secondary | ICD-10-CM

## 2016-01-30 DIAGNOSIS — E785 Hyperlipidemia, unspecified: Secondary | ICD-10-CM

## 2016-01-30 DIAGNOSIS — I639 Cerebral infarction, unspecified: Principal | ICD-10-CM

## 2016-01-30 DIAGNOSIS — I10 Essential (primary) hypertension: Secondary | ICD-10-CM

## 2016-01-30 DIAGNOSIS — I25709 Atherosclerosis of coronary artery bypass graft(s), unspecified, with unspecified angina pectoris: Secondary | ICD-10-CM

## 2016-01-30 DIAGNOSIS — I63441 Cerebral infarction due to embolism of right cerebellar artery: Secondary | ICD-10-CM | POA: Diagnosis present

## 2016-01-30 LAB — LIPID PANEL
CHOLESTEROL: 126 mg/dL (ref 0–200)
HDL: 34 mg/dL — AB (ref >40)
LDL CALC: 59 mg/dL (ref 0–99)
TRIGLYCERIDES: 166 mg/dL — AB (ref <150)
Total CHOL/HDL Ratio: 3.7 RATIO
VLDL: 33 mg/dL (ref 0–40)

## 2016-01-30 LAB — HEMOGLOBIN A1C
Hgb A1c MFr Bld: 5.8 % — ABNORMAL HIGH (ref 4.8–5.6)
Mean Plasma Glucose: 120 mg/dL

## 2016-01-30 MED ORDER — CLOPIDOGREL BISULFATE 75 MG PO TABS
75.0000 mg | ORAL_TABLET | Freq: Every day | ORAL | Status: DC
  Administered 2016-01-31 – 2016-02-02 (×3): 75 mg via ORAL
  Filled 2016-01-30 (×3): qty 1

## 2016-01-30 MED ORDER — ASPIRIN EC 81 MG PO TBEC
81.0000 mg | DELAYED_RELEASE_TABLET | Freq: Every day | ORAL | Status: DC
  Administered 2016-01-31 – 2016-02-02 (×3): 81 mg via ORAL
  Filled 2016-01-30 (×3): qty 1

## 2016-01-30 MED ORDER — IOPAMIDOL (ISOVUE-370) INJECTION 76%
INTRAVENOUS | Status: AC
  Administered 2016-01-30: 50 mL
  Filled 2016-01-30: qty 50

## 2016-01-30 MED ORDER — ASPIRIN EC 81 MG PO TBEC: 81.0000 mg | DELAYED_RELEASE_TABLET | Freq: Every day | ORAL | Status: DC

## 2016-01-30 NOTE — Evaluation (Signed)
Speech Language Pathology Evaluation Patient Details Name: Eliseo Gumdwin Ventura MRN: [id number] DOB: 03-21-1917 Today's Date: 01/30/2016 Time: [id number] SLP Time Calculation (min) (ACUTE ONLY): 30 min  Problem List:  Patient Active Problem List   Diagnosis Date Noted  . Stroke (cerebrum) (HCC) 01/29/2016  . Cerebellar stroke (HCC) 01/29/2016  . Dehydration 01/29/2016  . Essential hypertension 01/29/2016  . Hyperlipidemia 01/29/2016  . CAD (coronary artery disease) of bypass graft 01/29/2016  . Glaucoma 01/29/2016   Past Medical History:  Past Medical History:  Diagnosis Date  . Coronary artery disease   . Glaucoma   . Hyperlipidemia   . Hypertension    Past Surgical History:  Past Surgical History:  Procedure Laterality Date  . CORONARY ARTERY BYPASS GRAFT     3-vessel around 1990.  [facility]   HPI:  82 y.o. male history of hypertension, hyperlipidemia, coronary artery disease and glaucoma who presented with new onset stable gait and lightheadedness.  MRI positive for with acute right PICA distribution ischemic infarction as well as possible acute pontine infarction. Pt resides alone and was still driving prior to admission. He reports he manages all of his home duties.  He is a retired Theatre managerteacher/principal.     Assessment / Plan / Recommendation Clinical Impression  Pt presents with intact cognitive linguistic skills for current environment.  MOCA administered with pt scoring 24/30- area of deficits included delayed recall with pt requiring cues to recall 4/5 words, inability to identify 1 word despite choice of 3.  His expressive/receptive language skills are excellent - although hearing loss impacts efficient reception. SLP provided pt with written memory compensation strategies and implored him to have family members assure duties are being managed appropriately for safety.      SLP Assessment  Patient does not need any further Speech Lanaguage Pathology Services     Follow Up Recommendations       Frequency and Duration           SLP Evaluation Cognition  Overall Cognitive Status: No family/caregiver present to determine baseline cognitive functioning Arousal/Alertness: Awake/alert Orientation Level: Oriented X4 Attention: Sustained Sustained Attention: Appears intact Memory: Impaired Memory Impairment: Retrieval deficit (recalled 4/5 words with cues, 1/5 could not identify from choice of 3, MIS score 7/15) Awareness: Appears intact Problem Solving: Appears intact Safety/Judgment: Appears intact       Comprehension  Auditory Comprehension Overall Auditory Comprehension: Appears within functional limits for tasks assessed Yes/No Questions: Not tested Commands: Within Functional Limits Conversation: Complex Interfering Components: Hearing EffectiveTechniques: Increased volume Visual Recognition/Discrimination Discrimination: Not tested Reading Comprehension Reading Status: Within funtional limits (for reading the clock , pt has glaucoma)    Expression Expression Primary Mode of Expression: Verbal Verbal Expression Overall Verbal Expression: Appears within functional limits for tasks assessed Initiation: No impairment Level of Generative/Spontaneous Verbalization: Conversation Repetition: No impairment Naming: No impairment Pragmatics: No impairment Written Expression Dominant Hand: Right Written Expression:  (pt drew clock, copied cube accurately)   Oral / Motor  Oral Motor/Sensory Function Overall Oral Motor/Sensory Function: Within functional limits Motor Speech Overall Motor Speech: Appears within functional limits for tasks assessed Respiration: Within functional limits Phonation: Low vocal intensity Resonance: Within functional limits Articulation: Within functional limitis Intelligibility: Intelligible Motor Planning: Witnin functional limits Motor Speech Errors: Not applicable   GO                    Mills KollerKimball,  Godric Lavell Ann Taylr Meuth, MS [facility] SLP [phone]

## 2016-01-30 NOTE — Progress Notes (Signed)
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 VS notable for hypertension in the 150-160s, HR in the 50-60, breathing comfortably on room air.  MRI brain demonstrated an acute large right cerebellar PICA territory infarct with punctate acute infarct versus artifact in the pons.  Neurology recommended admission to [facility] for ongoing work up and management by stroke team.   Assessment & Plan: Principal Problem:   Cerebellar stroke [facility]) Active Problems:   Stroke (cerebrum) (HCC)   Dehydration   Benign essential HTN   Hyperlipidemia   CAD (coronary artery disease) of bypass graft   Glaucoma   Acute CVA (cerebrovascular accident) (HCC)   Memory deficit after cerebral infarction   Bradycardia   Leukocytosis  Acute cerebellar stroke: with residual trunkal ataxia and nausea. PT and OT recommending CIR. CIR has been consulted.  -  Telemetry  -  CTA head and neck: No significant stenosis -  Echocardiogram pending -  Aspirin 81mg  + plavix 75mg  daily x3 months, then plavix alone -  LDL at goal, 59.   -  A1c pending -  PT/OT/SLP  -  Stroke team following  Dehydration: Resolved. - D/C IVF  Hypertension: Chronic, stable. -  Continue lisinopril  Hyperlipidemia -  Continue simvastatin  CAD with hx of CABG, LBBB, stable angina -  Continue aspirin, statin, and ACEI.  Not on beta blocker due to bradycardia -  Continue NTG  Chronic diastolic heart failure, appears euvolemic -  Continue lasix 40mg  daily  Glaucoma: Chronic, stable -  Continue bimatoprost annd combigan  DVT prophylaxis: Lovenox Code Status: Partial code reviewed at admission: If heart  stops beating, no CP, shocks, medications, but would want ICU, ventilator, vasopressors, and medications for arrhythmias.   Family Communication: Discussed by phone with daughter, Fleet Contras. Disposition Plan: Continue stroke work up, to CIR likely in next 24 hours.  Consultants:   Stroke team  Procedures:   Echo pending  Antimicrobials:  None   Subjective: Pt without complaints. Ate breakfast.   Objective: Vitals:   01/30/16 0600 01/30/16 0751 01/30/16 0853 01/30/16 1437  BP: (!) 156/60 (!) 157/51 (!) 128/49 (!) 136/56  Pulse: 68  61 (!) 51  Resp: 16 15 20 20   Temp: 97.4 F (36.3 C) 98.2 F (36.8 C) 98 F (36.7 C) 97.7 F (36.5 C)  TempSrc: Oral Oral Oral Oral  SpO2: 100% 94% 96% 100%  Weight:      Height:        Intake/Output Summary (Last 24 hours) at 01/30/16 1520 Last data filed at 01/30/16 0849  Gross per 24 hour  Intake             1080 ml  Output              500 ml  Net              580 ml   Filed Weights   01/29/16 1851 01/29/16 2000  Weight: 77.1 kg (170 lb) 76.1 kg (167 lb 12.3 oz)    Examination: General exam: 82 y.o. male in no distress Respiratory system: Non-labored breathing. Clear to auscultation bilaterally.  Cardiovascular system: Regular rate and rhythm. No murmur, rub, or gallop. No JVD, and no  pedal edema. Gastrointestinal system: Abdomen soft, non-tender, non-distended, with normoactive bowel sounds. No organomegaly or masses felt. Central nervous system: Alert and oriented. No focal neurological deficits. Able to sit up unassisted. Extremities: Warm, no deformities Skin: No rashes, lesions no ulcers Psychiatry: Judgement and insight appear normal. Mood & affect appropriate.   Data Reviewed: I have personally reviewed following labs and imaging studies  CBC:  Recent Labs Lab 01/29/16 0954  WBC 12.2*  HGB 14.3  HCT 42.7  MCV 91.6  PLT 211   Basic Metabolic Panel:  Recent Labs Lab 01/29/16 0954  NA 139  K 4.4  CL 101    CO2 29  GLUCOSE 131*  BUN 20  CREATININE 0.90  CALCIUM 9.3   GFR: Estimated Creatinine Clearance: 46.4 mL/min (by C-G formula based on SCr of 0.9 mg/dL). Liver Function Tests:  Recent Labs Lab 01/29/16 0954  AST 21  ALT 17  ALKPHOS 54  BILITOT 1.2  PROT 6.8  ALBUMIN 4.1    Recent Labs Lab 01/29/16 0954  LIPASE 20   No results for input(s): AMMONIA in the last 168 hours. Coagulation Profile: No results for input(s): INR, PROTIME in the last 168 hours. Cardiac Enzymes: No results for input(s): CKTOTAL, CKMB, CKMBINDEX, TROPONINI in the last 168 hours. BNP (last 3 results) No results for input(s): PROBNP in the last 8760 hours. HbA1C: No results for input(s): HGBA1C in the last 72 hours. CBG: No results for input(s): GLUCAP in the last 168 hours. Lipid Profile:  Recent Labs  01/30/16 0636  CHOL 126  HDL 34*  LDLCALC 59  TRIG 409166*  CHOLHDL 3.7   Thyroid Function Tests: No results for input(s): TSH, T4TOTAL, FREET4, T3FREE, THYROIDAB in the last 72 hours. Anemia Panel: No results for input(s): VITAMINB12, FOLATE, FERRITIN, TIBC, IRON, RETICCTPCT in the last 72 hours. Urine analysis:    Component Value Date/Time   COLORURINE YELLOW 01/29/2016 0954   APPEARANCEUR CLEAR 01/29/2016 0954   LABSPEC 1.025 01/29/2016 0954   PHURINE 5.5 01/29/2016 0954   GLUCOSEU NEGATIVE 01/29/2016 0954   HGBUR NEGATIVE 01/29/2016 0954   BILIRUBINUR NEGATIVE 01/29/2016 0954   KETONESUR NEGATIVE 01/29/2016 0954   PROTEINUR NEGATIVE 01/29/2016 0954   UROBILINOGEN 1.0 05/19/2010 2127   NITRITE NEGATIVE 01/29/2016 0954   LEUKOCYTESUR NEGATIVE 01/29/2016 0954   Sepsis Labs: @LABRCNTIP (procalcitonin:4,lacticidven:4)  )No results found for this or any previous visit (from the past 240 hour(s)).   Radiology Studies: Ct Angio Head W Or Wo Contrast  Result Date: 01/30/2016 CLINICAL DATA:  Follow-up posterior circulation stroke. History of hypertension, hyperlipidemia and CABG.  EXAM: CT ANGIOGRAPHY HEAD AND NECK TECHNIQUE: Multidetector CT imaging of the head and neck was performed using the standard protocol during bolus administration of intravenous contrast. Multiplanar CT image reconstructions and MIPs were obtained to evaluate the vascular anatomy. Carotid stenosis measurements (when applicable) are obtained utilizing NASCET criteria, using the distal internal carotid diameter as the denominator. CONTRAST:  50 cc Isovue 370 COMPARISON:  MRI of the head January 29, 2016 FINDINGS: CT HEAD BRAIN: Wedge-like hypodensity RIGHT inferior cerebellum with folia effacement. Mild mass effect on the obex, fourth ventricle remains open. The ventricles and sulci are normal for age. No intraparenchymal hemorrhage, mass effect nor midline shift. Patchy supratentorial white matter hypodensities less than expected for patient's age, though non-specific are most compatible with chronic small vessel ischemic disease. No abnormal extra-axial fluid collections. Basal cisterns are patent. VASCULAR: Severe calcific atherosclerosis of the carotid siphons. Moderate calcific atherosclerosis of bilateral  vertebral arteries. SKULL: No skull fracture. No significant scalp soft tissue swelling. SINUSES/ORBITS: The mastoid air-cells and included paranasal sinuses are well-aerated. Status post bilateral ocular lens implants. The included ocular globes and orbital contents are non-suspicious. OTHER: None. CTA NECK AORTIC ARCH: Normal appearance of the thoracic arch, normal branch pattern. Moderate calcific atherosclerosis. Mild stenosis LEFT subclavian artery origin. Common origin of the innominate and LEFT Common carotid artery which is widely patent. RIGHT CAROTID SYSTEM: Common carotid artery is patent, coursing in a straight line fashion. Severe calcific atherosclerosis of the distal Common carotid artery. Normal appearance of the carotid bifurcation without hemodynamically significant stenosis by NASCET  criteria. Normal appearance of the included internal carotid artery. LEFT CAROTID SYSTEM: Common carotid artery is widely patent, coursing in a straight line fashion. Normal appearance of the carotid bifurcation without hemodynamically significant stenosis by NASCET criteria. Moderate eccentric calcific atherosclerosis. Normal appearance of the included internal carotid artery. VERTEBRAL ARTERIES:Codominant vertebral artery's. Normal appearance of the vertebral arteries, which appear widely patent. SKELETON: No acute osseous process though bone windows have not been submitted. Moderate C5-6 and C6-7 degenerative discs. Severe bilateral C5-6 and C6-7 neural foraminal narrowing. Moderate canal stenosis C5-6. OTHER NECK: Soft tissues of the neck are non-acute though, not tailored for evaluation. Punctate RIGHT parotid sialolith. CTA HEAD ANTERIOR CIRCULATION: Patent bilateral cervical, petrous, cavernous internal carotid arteries with mild stenosis the carotid siphon data heavy vascular calcifications. Bilateral anterior and middle cerebral arteries are widely patent. Mild luminal regularity. No large vessel occlusion, dissection, contrast extravasation or aneurysm. POSTERIOR CIRCULATION: Normal appearance of the vertebral arteries, vertebrobasilar junction and basilar artery. Occluded RIGHT posterior-inferior cerebellar artery approximate 1 cm from the origin. Patent cerebral arteries with moderate tandem stenoses. No dissection, contrast extravasation or aneurysm. VENOUS SINUSES: Major dural venous sinuses are patent though not tailored for evaluation on this angiographic examination. ANATOMIC VARIANTS: None. DELAYED PHASE: No abnormal enhancement. IMPRESSION: CT HEAD: Evolving RIGHT posterior-inferior cerebellar artery territory acute infarct without hemorrhagic conversion. Otherwise negative CT HEAD for age. CTA NECK: Atherosclerosis without hemodynamically significant stenosis or acute vascular process. CTA HEAD:  Acute RIGHT posterior-inferior cerebellar artery occlusion within 1 cm of the origin. Moderate tandem stenosis bilateral posterior cerebral arteries compatible with atherosclerosis. Mild anterior circulation atherosclerosis. Severe calcific atherosclerosis of the carotid siphons. Electronically Signed   By: Awilda Metro M.D.   On: 01/30/2016 02:34   Dg Chest 2 View  Result Date: 01/29/2016 CLINICAL DATA:  Weakness EXAM: CHEST  2 VIEW COMPARISON:  05/19/2010 chest radiograph. FINDINGS: Sternotomy wires appear aligned and intact. Stable cardiomediastinal silhouette with top-normal heart size and aortic atherosclerosis. No pneumothorax. No pleural effusion. No pulmonary edema. No acute consolidative airspace disease. IMPRESSION: No active cardiopulmonary disease. Aortic atherosclerosis. Electronically Signed   By: Delbert Phenix M.D.   On: 01/29/2016 12:51   Ct Angio Neck W Or Wo Contrast  Result Date: 01/30/2016 CLINICAL DATA:  Follow-up posterior circulation stroke. History of hypertension, hyperlipidemia and CABG. EXAM: CT ANGIOGRAPHY HEAD AND NECK TECHNIQUE: Multidetector CT imaging of the head and neck was performed using the standard protocol during bolus administration of intravenous contrast. Multiplanar CT image reconstructions and MIPs were obtained to evaluate the vascular anatomy. Carotid stenosis measurements (when applicable) are obtained utilizing NASCET criteria, using the distal internal carotid diameter as the denominator. CONTRAST:  50 cc Isovue 370 COMPARISON:  MRI of the head January 29, 2016 FINDINGS: CT HEAD BRAIN: Wedge-like hypodensity RIGHT inferior cerebellum with folia effacement. Mild mass effect on the  obex, fourth ventricle remains open. The ventricles and sulci are normal for age. No intraparenchymal hemorrhage, mass effect nor midline shift. Patchy supratentorial white matter hypodensities less than expected for patient's age, though non-specific are most compatible with  chronic small vessel ischemic disease. No abnormal extra-axial fluid collections. Basal cisterns are patent. VASCULAR: Severe calcific atherosclerosis of the carotid siphons. Moderate calcific atherosclerosis of bilateral vertebral arteries. SKULL: No skull fracture. No significant scalp soft tissue swelling. SINUSES/ORBITS: The mastoid air-cells and included paranasal sinuses are well-aerated. Status post bilateral ocular lens implants. The included ocular globes and orbital contents are non-suspicious. OTHER: None. CTA NECK AORTIC ARCH: Normal appearance of the thoracic arch, normal branch pattern. Moderate calcific atherosclerosis. Mild stenosis LEFT subclavian artery origin. Common origin of the innominate and LEFT Common carotid artery which is widely patent. RIGHT CAROTID SYSTEM: Common carotid artery is patent, coursing in a straight line fashion. Severe calcific atherosclerosis of the distal Common carotid artery. Normal appearance of the carotid bifurcation without hemodynamically significant stenosis by NASCET criteria. Normal appearance of the included internal carotid artery. LEFT CAROTID SYSTEM: Common carotid artery is widely patent, coursing in a straight line fashion. Normal appearance of the carotid bifurcation without hemodynamically significant stenosis by NASCET criteria. Moderate eccentric calcific atherosclerosis. Normal appearance of the included internal carotid artery. VERTEBRAL ARTERIES:Codominant vertebral artery's. Normal appearance of the vertebral arteries, which appear widely patent. SKELETON: No acute osseous process though bone windows have not been submitted. Moderate C5-6 and C6-7 degenerative discs. Severe bilateral C5-6 and C6-7 neural foraminal narrowing. Moderate canal stenosis C5-6. OTHER NECK: Soft tissues of the neck are non-acute though, not tailored for evaluation. Punctate RIGHT parotid sialolith. CTA HEAD ANTERIOR CIRCULATION: Patent bilateral cervical, petrous,  cavernous internal carotid arteries with mild stenosis the carotid siphon data heavy vascular calcifications. Bilateral anterior and middle cerebral arteries are widely patent. Mild luminal regularity. No large vessel occlusion, dissection, contrast extravasation or aneurysm. POSTERIOR CIRCULATION: Normal appearance of the vertebral arteries, vertebrobasilar junction and basilar artery. Occluded RIGHT posterior-inferior cerebellar artery approximate 1 cm from the origin. Patent cerebral arteries with moderate tandem stenoses. No dissection, contrast extravasation or aneurysm. VENOUS SINUSES: Major dural venous sinuses are patent though not tailored for evaluation on this angiographic examination. ANATOMIC VARIANTS: None. DELAYED PHASE: No abnormal enhancement. IMPRESSION: CT HEAD: Evolving RIGHT posterior-inferior cerebellar artery territory acute infarct without hemorrhagic conversion. Otherwise negative CT HEAD for age. CTA NECK: Atherosclerosis without hemodynamically significant stenosis or acute vascular process. CTA HEAD: Acute RIGHT posterior-inferior cerebellar artery occlusion within 1 cm of the origin. Moderate tandem stenosis bilateral posterior cerebral arteries compatible with atherosclerosis. Mild anterior circulation atherosclerosis. Severe calcific atherosclerosis of the carotid siphons. Electronically Signed   By: Awilda Metro M.D.   On: 01/30/2016 02:34   Mr Brain Wo Contrast  Result Date: 01/29/2016 CLINICAL DATA:  Dizziness with gait disturbance. Nausea, vomiting, and weakness for 2 days. EXAM: MRI HEAD WITHOUT CONTRAST TECHNIQUE: Multiplanar, multiecho pulse sequences of the brain and surrounding structures were obtained without intravenous contrast. COMPARISON:  05/23/2010 FINDINGS: Brain: There is a large acute right cerebellar PICA territory infarct. There is associated cytotoxic edema without significant posterior fossa mass effect or evidence of associated hemorrhage. There is a  punctate acute infarct versus artifact in the right lower pons, not confirmed on coronal imaging. Partially empty sella is unchanged. Chronic microhemorrhages are noted in posterior right frontal cortex and left centrum semiovale. No mass, midline shift, or extra-axial fluid collection is seen. There is age-appropriate cerebral  atrophy. Deep cerebral white matter T2 hyperintensities have mildly progressed from 2012 and are nonspecific but compatible with mild chronic small vessel ischemic disease. Vascular: Major intracranial vascular flow voids are preserved. Skull and upper cervical spine: No suspicious osseous lesion. Sinuses/Orbits: Prior bilateral cataract extraction.  Clear sinuses. Other: None. IMPRESSION: 1. Acute large right cerebellar PICA territory infarct. 2. Punctate acute infarct versus artifact in the pons. 3. Mild chronic small vessel ischemic disease. Electronically Signed   By: Sebastian Ache M.D.   On: 01/29/2016 15:45    Scheduled Meds: . [START ON 01/31/2016] aspirin EC  81 mg Oral Daily  . brimonidine  1 drop Both Eyes Q12H   And  . timolol  1 drop Both Eyes Q12H  . cholecalciferol  1,000 Units Oral Daily  . clopidogrel  75 mg Oral Daily  . docusate sodium  100 mg Oral BID  . enoxaparin (LOVENOX) injection  40 mg Subcutaneous Q24H  . finasteride  5 mg Oral Daily  . furosemide  40 mg Oral Daily  . latanoprost  1 drop Both Eyes QHS  . lisinopril  5 mg Oral Daily  . potassium chloride  10 mEq Oral Daily  . simvastatin  20 mg Oral Daily  . vitamin B-12  100 mcg Oral Daily   Continuous Infusions: . sodium chloride 1,000 mL (01/29/16 2150)     LOS: 1 day   Time spent: 25 minutes.  Hazeline Junker, MD [facility] Pager [phone]  If 7PM-7AM, please contact night-coverage [email or web address] Password [email or web address] 01/30/2016, 3:20 PM

## 2016-01-30 NOTE — Progress Notes (Signed)
Patient began c/o nausea, lightheadedness. VSS, see flowsheet. Neuro assessment unchanged, NIHHS 0, denies any headache, pain, blurred or double vision. Zofran administered. Patient requested to use bathroom, passed gas, returned to bed, laying flat on side. Dr. Jarvis NewcomerGrunz notified. No new orders, will continue to monitor patient.

## 2016-01-30 NOTE — Care Management Note (Signed)
Case Management Note  Patient Details  Name: Bruce Clarke MRN: [id number] Date of Birth: 1917/03/04  Subjective/Objective:   Pt admitted with CVA. He is from home with family.                  Action/Plan: PT recommending CIR. CM following for d/c disposition.  Expected Discharge Date:                  Expected Discharge Plan:  IP Rehab Facility  In-House Referral:     Discharge planning Services     Post Acute Care Choice:    Choice offered to:     DME Arranged:    DME Agency:     HH Arranged:    HH Agency:     Status of Service:  In process, will continue to follow  If discussed at Long Length of Stay Meetings, dates discussed:    Additional Comments:  Kermit BaloKelli F Meriam Chojnowski, RN 01/30/2016, 11:15 AM

## 2016-01-30 NOTE — Progress Notes (Signed)
STROKE TEAM PROGRESS NOTE   HISTORY OF PRESENT ILLNESS (per record) Bruce Clarke is an 82 y.o. male history of hypertension, hyperlipidemia, coronary artery disease and glaucoma who presented to the ED at [facility] with new onset unstable gait and lightheadedness. He was last known well on 01/28/2016, time unknown. Is no previous history of stroke nor TIA. He's been taking aspirin daily. MRI of his brain showed an acute right PICA territory cerebellar infarction as well as an equivocal acute pontine infarction. NIH stroke score the time of evaluation was 0. Patient has had no change in speech or swallowing. No weakness of extremities normal coordination abnormality is been noted. No clear deficits. Patient was not administered IV t-PA secondary to being beyond time window for treatment consideration. He was admitted for further evaluation and treatment.   SUBJECTIVE (INTERVAL HISTORY) No family is at the bedside.  Patient sitting up in the geri chair at the bedside. Overall he feels his condition is stable. Understands he needs rehab, reports he is HOH.   OBJECTIVE Temp:  [97.2 F (36.2 C)-98.2 F (36.8 C)] 98 F (36.7 C) (09/25 0853) Pulse Rate:  [48-76] 61 (09/25 0853) Cardiac Rhythm: Heart block;Bundle branch block (09/25 0700) Resp:  [14-20] 20 (09/25 0853) BP: (116-182)/(39-67) 128/49 (09/25 0853) SpO2:  [88 %-100 %] 96 % (09/25 0853) Weight:  [76.1 kg (167 lb 12.3 oz)-77.1 kg (170 lb)] 76.1 kg (167 lb 12.3 oz) (09/24 2000)  CBC:   Recent Labs Lab 01/29/16 0954  WBC 12.2*  HGB 14.3  HCT 42.7  MCV 91.6  PLT 211    Basic Metabolic Panel:   Recent Labs Lab 01/29/16 0954  NA 139  K 4.4  CL 101  CO2 29  GLUCOSE 131*  BUN 20  CREATININE 0.90  CALCIUM 9.3    Lipid Panel:     Component Value Date/Time   CHOL 126 01/30/2016 0636   TRIG 166 (H) 01/30/2016 0636   HDL 34 (L) 01/30/2016 0636   CHOLHDL 3.7 01/30/2016 0636   VLDL 33 01/30/2016 0636   LDLCALC  59 01/30/2016 0636   HgbA1c: No results found for: HGBA1C Urine Drug Screen: No results found for: LABOPIA, COCAINSCRNUR, LABBENZ, AMPHETMU, THCU, LABBARB    IMAGING I have personally reviewed the radiological images below and agree with the radiology interpretations.  Dg Chest 2 View 01/29/2016 No active cardiopulmonary disease. Aortic atherosclerosis. Electronically Signed   By: Delbert Phenix M.D.   On: 01/29/2016 12:51   CT HEAD 01/30/2016 Evolving RIGHT posterior-inferior cerebellar artery territory acute infarct without hemorrhagic conversion. Otherwise negative CT HEAD for age.   CTA NECK 01/30/2016 Atherosclerosis without hemodynamically significant stenosis or acute vascular process.   CTA HEAD 01/30/2016 Acute RIGHT posterior-inferior cerebellar artery occlusion within 1 cm of the origin. Moderate tandem stenosis bilateral posterior cerebral arteries compatible with atherosclerosis. Mild anterior circulation atherosclerosis. Severe calcific atherosclerosis of the carotid siphons.   Mr Brain Wo Contrast 01/29/2016 1. Acute large right cerebellar PICA territory infarct. 2. Punctate acute infarct versus artifact in the pons. 3. Mild chronic small vessel ischemic disease.   TTE - pending   PHYSICAL EXAM  Temp:  [97.3 F (36.3 C)-98.2 F (36.8 C)] 97.7 F (36.5 C) (09/25 2202) Pulse Rate:  [48-68] 62 (09/25 2202) Resp:  [15-20] 18 (09/25 2202) BP: (116-164)/(39-60) 164/51 (09/25 2202) SpO2:  [88 %-100 %] 97 % (09/25 2202)  General - Well nourished, well developed, in no apparent distress.  Ophthalmologic - Fundi not visualized  due to eye movement.  Cardiovascular - Regular rate and rhythm.  Mental Status -  Level of arousal and orientation to time, place, and person were intact. Language including expression, naming, repetition, comprehension was assessed and found intact. Fund of Knowledge was assessed and was intact.  Cranial Nerves II - XII - II - Visual field  intact OU. III, IV, VI - Extraocular movements intact. V - Facial sensation intact bilaterally. VII - Facial movement intact bilaterally. VIII - Hard of hearing & vestibular intact bilaterally. X - Palate elevates symmetrically. XI - Chin turning & shoulder shrug intact bilaterally. XII - Tongue protrusion intact.  Motor Strength - The patient's strength was normal in all extremities and pronator drift was absent.  Bulk was normal and fasciculations were absent.   Motor Tone - Muscle tone was assessed at the neck and appendages and was normal.  Reflexes - The patient's reflexes were 1+ in all extremities and he had no pathological reflexes.  Sensory - Light touch, temperature/pinprick were assessed and were symmetrical.    Coordination - The patient had normal movements in the hands and feet with no ataxia or dysmetria.  Tremor was absent.  Gait and Station - deferred due to safety concerns.   ASSESSMENT/PLAN Bruce Clarke is a 82 y.o. male with history of hypertension, hyperlipidemia, coronary artery disease and glaucoma presenting with unstable gait and lightheadedness. He did not receive IV t-PA due to delay in arrival, no clear deficits.   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 Small pontine infarct.   CTA head R PICA occlusion. Tandem anterior circulation steosis  CTA neck  No significant stenosis  2D Echo  pending   Recommend 30 day cardiac event monitoring to rule out afib as outpt  LDL 59  HgbA1c 5.8  Lovenox 40 mg sq daily for VTE prophylaxis Diet Heart Room service appropriate? Yes; Fluid consistency: Thin  aspirin 81 mg daily prior to admission, now on aspirin 325 mg daily . Given large vessel intracranial atherosclerosis in R PICA, patient should be treated with aspirin 81 mg  and clopidogrel 75 mg orally every day x 3 months for secondary stroke prevention. After 3 months, change to plavix alone. Long-term dual antiplatelets are contraindicated due to risk for intracerebral hemorrhage.   Patient counseled to be compliant with his antithrombotic medications  OP 30 day monitoring recommended to look for atrial fibrillation   Ongoing aggressive stroke risk factor management  Therapy recommendations:  CIR. Consult in place  Disposition:  pending   Hypertension  Stable  Permissive hypertension (OK if < 220/120) but gradually normalize in 5-7 days  Long-term BP goal normotensive  Hyperlipidemia  Home meds:  zocor 20, resumed in hospital  LDL 59, goal < 70  Continue statin at discharge  Other Stroke Risk Factors  Advanced age  Former Cigarette smoker  Family hx stroke (Father)  Coronary artery disease s/p CABG 1990, stable angina, LBBB  Chronic diastolic CHF on lasix  Other Active Problems  Dehydration  Glaucoma  Hospital day # 1  Neurology will sign off. Please call with questions. [facility]. Thanks for the consult.  Marvel PlanJindong Lovena Kluck, MD PhD Stroke Neurology 01/30/2016 11:39 PM   To contact Stroke Continuity provider, please refer to  [email or web address] After hours, contact General Neurology

## 2016-01-30 NOTE — Evaluation (Signed)
Physical Therapy Evaluation Patient Details Name: Bruce Clarke MRN: [id number] DOB: [birth year] Today's Date: 01/30/2016   History of Present Illness  Bruce Clarke is an 82 y.o. male history of hypertension, hyperlipidemia, coronary artery disease and glaucoma who presented with new onset stable gait and lightheadedness.  MRI positive for with acute right PICA distribution ischemic infarction as well as possible acute pontine infarction   Clinical Impression  Patient presents with decreased independence with mobility due to deficits listed in PT problem list.  He will benefit from skilled PT in the acute setting to allow return to independent following CIR level rehab stay.  Can also go to daughter's home prior to d/c home alone.     Follow Up Recommendations CIR    Equipment Recommendations  None recommended by PT    Recommendations for Other Services Rehab consult     Precautions / Restrictions Precautions Precautions: Fall Restrictions Weight Bearing Restrictions: No      Mobility  Bed Mobility               General bed mobility comments: up in chair  Transfers Overall transfer level: Needs assistance Equipment used: Rolling walker (2 wheeled) Transfers: Sit to/from Stand Sit to Stand: Min assist         General transfer comment: to steady when coming upright due to LOB to R  Ambulation/Gait Ambulation/Gait assistance: Mod assist;Min assist Ambulation Distance (Feet): 120 Feet (x 2) Assistive device: Rolling walker (2 wheeled) Gait Pattern/deviations: Step-through pattern;Decreased stride length;Decreased step length - right;Drifts right/left;Trunk flexed;Staggering right     General Gait Details: ataxic leaning to R with cues for upright posture, forward gaze for orientation and improvement in lateral lean at times allowing for min A, but initially mod A  Stairs            Wheelchair Mobility    Modified Rankin (Stroke Patients Only) Modified  Rankin (Stroke Patients Only) Pre-Morbid Rankin Score: No symptoms Modified Rankin: Moderately severe disability     Balance Overall balance assessment: Needs assistance   Sitting balance-Leahy Scale: Fair     Standing balance support: Bilateral upper extremity supported Standing balance-Leahy Scale: Poor Standing balance comment: min A and UE support for static balance initially.  Patient with R lateral lean; h/o one fall in 6 months, tripped over something, reports also leaned to R after bypass surgery with practicing steps                             Pertinent Vitals/Pain Pain Assessment: No/denies pain    Home Living Family/patient expects to be discharged to:: Private residence Living Arrangements: Alone Available Help at Discharge: Family Type of Home: House Home Access: Level entry     Home Layout: One level Home Equipment: Cane - single point;Walker - 2 wheels;Shower seat;Grab bars - tub/shower;Hand held shower head      Prior Function Level of Independence: Independent with assistive device(s);Needs assistance      ADL's / Homemaking Assistance Needed: assist for laundry and house cleaning twice monthly  Comments: walks with a cane     Hand Dominance        Extremity/Trunk Assessment   Upper Extremity Assessment: Overall WFL for tasks assessed (some decreased coordination pron/sup)           Lower Extremity Assessment: Overall WFL for tasks assessed      Cervical / Trunk Assessment: Kyphotic  Communication   Communication: HOH  Cognition  Arousal/Alertness: Awake/alert Behavior During Therapy: WFL for tasks assessed/performed Overall Cognitive Status: Within Functional Limits for tasks assessed                      General Comments      Exercises     Assessment/Plan    PT Assessment Patient needs continued PT services  PT Problem List Decreased activity tolerance;Decreased balance;Decreased mobility;Decreased  knowledge of use of DME;Decreased coordination;Decreased safety awareness          PT Treatment Interventions DME instruction;Gait training;Stair training;Therapeutic activities;Therapeutic exercise;Balance training;Functional mobility training;Patient/family education    PT Goals (Current goals can be found in the Care Plan section)  Acute Rehab PT Goals Patient Stated Goal: To go to rehab then daughter's home PT Goal Formulation: With patient Time For Goal Achievement: 02/13/16 Potential to Achieve Goals: Good    Frequency Min 4X/week   Barriers to discharge Decreased caregiver support      Co-evaluation               End of Session Equipment Utilized During Treatment: Gait belt Activity Tolerance: Patient tolerated treatment well Patient left: in chair;with call bell/phone within reach;with chair alarm set           Time: 0932-1000 PT Time Calculation (min) (ACUTE ONLY): 28 min   Charges:   PT Evaluation $PT Eval Moderate Complexity: 1 Procedure PT Treatments $Gait Training: 8-22 mins   PT G CodesElray Clarke:        Bruce Clarke 01/30/2016, 10:49 AM  Bruce Clarke, PT [phone] 01/30/2016

## 2016-01-30 NOTE — Progress Notes (Signed)
I met with pt at bedside to discuss a possible inpt rehab admission. He prefers inpt rehab here. I will contact his daughter to discuss her plans and then will initiate insurance authorization with [facility]. I will follow up tomorrow. [phone]

## 2016-01-30 NOTE — Progress Notes (Signed)
Rehab Admissions Coordinator Note:  Patient was screened by Clois DupesBoyette, Val Farnam Godwin for appropriateness for an Inpatient Acute Rehab Consult per PT recommendation.   At this time, we are recommending Inpatient Rehab consult.  Clois DupesBoyette, Dominique Calvey Godwin 01/30/2016, 11:47 AM  I can be reached at [phone].

## 2016-01-30 NOTE — Consult Note (Signed)
Physical Medicine and Rehabilitation Consult   Reason for Consult: Ataxic gait.  Referring Physician: Dr. Jarvis Newcomer.    HPI: Bruce Clarke is a 82 y.o. male with history of CAD, HTN, glaucoma who was admitted on 01/29/16 with 2 day history of unsteady gait, N/V with inability to eat and dizziness. MRI brain done acute large right cerebellar stroke with cytotoxic edema and punctate infarct right lower pons. CTA head showed R-PICA occlusion with tandem anterior circulation stenosis. Stroke felt to be due to due to large vessel atherosclerosis and Dr. Roda Shutters recommended ASA/Plavix for 3 months followed by plavix alone. 30 day event monitor recommended to rule out A fib. Therapy evaluations done today and patient with delay in recall MOCA- 24/30, staggering gait with ataxia. CIR recommended by MD and rehab team for follow up therapy.   Lives in [place]. 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 He cooks--does have help with housework. Goes out about everyday and is involved in multiple social groups. Daughter in [place] works days.     Review of Systems  HENT: Positive for hearing loss.   Eyes: Negative for blurred vision and double vision.  Respiratory: Negative for cough and shortness of breath.   Cardiovascular: Negative for chest pain and palpitations.  Gastrointestinal: Positive for nausea. Negative for abdominal pain, constipation and vomiting.  Genitourinary: Positive for frequency.  Musculoskeletal: Negative for back pain and neck pain.  Skin: Negative for itching and rash.  Neurological: Positive for dizziness and weakness. Negative for sensory change, speech change, focal weakness and headaches.  Psychiatric/Behavioral: The patient is not nervous/anxious and does not have insomnia.   All other systems reviewed and are negative.    Past Medical History:  Diagnosis Date  . Coronary artery disease   . Glaucoma   . Hyperlipidemia   . Hypertension     Past Surgical History:  Procedure  Laterality Date  . CORONARY ARTERY BYPASS GRAFT     3-vessel around 1990.  Followed at [facility] History  Problem Relation Age of Onset  . CAD Mother   . Diabetes Mother   . Eczema Mother   . CAD Father     lived to 28 yo  . Stroke Father     Social History:  Lives alone.  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 Independent with cane PTA. He reports that he has quit smoking. He has never used smokeless tobacco. He reports that he does not drink alcohol or use drugs.    Allergies  Allergen Reactions  . Ibuprofen Swelling    Medications Prior to Admission  Medication Sig Dispense Refill  . aspirin EC 81 MG tablet Take 81 mg by mouth daily.    . bimatoprost (LUMIGAN) 0.01 % SOLN Place 1 drop into both eyes at bedtime.    . brimonidine-timolol (COMBIGAN) 0.2-0.5 % ophthalmic solution Place 1 drop into both eyes every 12 (twelve) hours.    . Cholecalciferol (VITAMIN D-1000 MAX ST) 1000 units tablet Take 1 tablet by mouth daily.    Marland Kitchen docusate sodium (STOOL SOFTENER) 100 MG capsule Take 100 mg by mouth 2 (two) times daily.    . finasteride (PROSCAR) 5 MG tablet Take 5 mg by mouth daily.    . furosemide (LASIX) 40 MG tablet Take 1 tablet by mouth daily.    Marland Kitchen ketoconazole (NIZORAL) 2 % cream Apply 1 application topically as directed.    Marland Kitchen lisinopril (PRINIVIL,ZESTRIL) 5 MG tablet  Take 1 tablet by mouth daily.    Marland Kitchen loratadine (CLARITIN) 10 MG tablet Take 10 mg by mouth daily as needed for allergies.    . nitroGLYCERIN (NITROSTAT) 0.4 MG SL tablet Take 1 tablet by mouth as directed.    . potassium chloride (MICRO-K) 10 MEQ CR capsule Take 1 capsule by mouth daily.    . simvastatin (ZOCOR) 20 MG tablet Take 1 tablet by mouth daily.    . vitamin B-12 (CYANOCOBALAMIN) 100 MCG tablet Take 100 mcg by mouth daily.      Home: Home Living Family/patient expects to be discharged to:: Private residence Living Arrangements: Alone Available Help at Discharge:  Family, Available PRN/intermittently (daughter calls every night, pt lives in [place], [place]) Type of Home: House Home Access: Level entry Home Layout: One level Bathroom Shower/Tub: Engineer, manufacturing systems: Standard Home Equipment: Gilmer Mor - single point, Environmental consultant - 2 wheels, Shower seat, Grab bars - tub/shower, Hand held shower head  Functional History: Prior Function Level of Independence: Independent with assistive device(s) ADL's / Homemaking Assistance Needed: assist for laundry and house cleaning twice monthly Comments: walks with a cane Functional Status:  Mobility: Bed Mobility General bed mobility comments: up in chair Transfers Overall transfer level: Needs assistance Equipment used: Rolling walker (2 wheeled) Transfers: Sit to/from Stand Sit to Stand: Min assist, Mod assist General transfer comment: from recliner and regular height toilet. cues for technique with rw. assist during powerup and to control descent. Ambulation/Gait Ambulation/Gait assistance: Mod assist, Min assist Ambulation Distance (Feet): 120 Feet (x 2) Assistive device: Rolling walker (2 wheeled) Gait Pattern/deviations: Step-through pattern, Decreased stride length, Decreased step length - right, Drifts right/left, Trunk flexed, Staggering right General Gait Details: ataxic leaning to R with cues for upright posture, forward gaze for orientation and improvement in lateral lean at times allowing for min A, but initially mod A    ADL: ADL Overall ADL's : Needs assistance/impaired Eating/Feeding: Set up, Sitting Grooming: Wash/dry hands, Min guard, Minimal assistance, Standing Grooming Details (indicate cue type and reason): steadying assist for balancein standing Upper Body Bathing: Set up, Sitting, Supervision/ safety Lower Body Bathing: Moderate assistance, Sit to/from stand Upper Body Dressing : Set up, Supervision/safety, Sitting Lower Body Dressing: Moderate assistance, Sit to/from  stand Toilet Transfer: Moderate assistance, Ambulation, Regular Toilet, RW, Grab bars Toilet Transfer Details (indicate cue type and reason): Cues for technique with rw. Assist during power up and to control descent.  Toileting- Clothing Manipulation and Hygiene: Moderate assistance, Sit to/from stand Tub/ Shower Transfer: Moderate assistance Functional mobility during ADLs: Moderate assistance, Rolling walker, Cueing for safety General ADL Comments: Pt completed in-room functional mobility, toilet transfer and clothing management, and grooming task at sink as detailed above. Pt with noted LE ataxia during OOB ADLs. Pt with good awareness of deficits and able to recall strategies inroduced earlier by PT.  Cognition: Cognition Overall Cognitive Status: Within Functional Limits for tasks assessed Arousal/Alertness: Awake/alert Orientation Level: Oriented X4 Attention: Sustained Sustained Attention: Appears intact Memory: Impaired Memory Impairment: Retrieval deficit (recalled 4/5 words with cues, 1/5 could not identify from choice of 3, MIS score 7/15) Awareness: Appears intact Problem Solving: Appears intact Safety/Judgment: Appears intact Cognition Arousal/Alertness: Awake/alert Behavior During Therapy: WFL for tasks assessed/performed Overall Cognitive Status: Within Functional Limits for tasks assessed  Blood pressure (!) 128/49, pulse 61, temperature 98 F (36.7 C), temperature source Oral, resp. rate 20, height 5\' 8"  (1.727 m), weight 76.1 kg (167 lb 12.3 oz), SpO2 96 %. Physical  Exam  Nursing note and vitals reviewed. Constitutional: He is oriented to person, place, and time. He appears well-developed and well-nourished.  HENT:  Head: Normocephalic and atraumatic.  Eyes: EOM are normal. Pupils are equal, round, and reactive to light. Left conjunctiva is injected. Scleral icterus is present.  Injected sclera  Neck: Normal range of motion. Neck supple.  Cardiovascular: Normal  rate and regular rhythm.   Murmur heard. Respiratory: Effort normal and breath sounds normal. No stridor. No respiratory distress. He has no wheezes.  GI: Soft. Bowel sounds are normal. There is no tenderness.  Musculoskeletal: He exhibits no edema or tenderness.  Decreased ROM bilateral knees.   Neurological: He is alert and oriented to person, place, and time.  Very mild dysarthria.  HOH Able to follow basic one and two step commands.  He was able to answer orientation questions without difficulty but repeated himself a couple of times.  Finger to nose intact without ataxia, no dysmetria.  Motor: 4+-5/5 throughout Sensation intact to light touch DTRs symmetric  Skin: Skin is warm and dry.  Psychiatric: He has a normal mood and affect. His speech is normal. Judgment normal. He is slowed. Cognition and memory are normal.    Results for orders placed or performed during the hospital encounter of 01/29/16 (from the past 24 hour(s))  Lipid panel     Status: Abnormal   Collection Time: 01/30/16  6:36 AM  Result Value Ref Range   Cholesterol 126 0 - 200 mg/dL   Triglycerides 098 (H) <150 mg/dL   HDL 34 (L) >11 mg/dL   Total CHOL/HDL Ratio 3.7 RATIO   VLDL 33 0 - 40 mg/dL   LDL Cholesterol 59 0 - 99 mg/dL   Ct Angio Head W Or Clarke Contrast  Result Date: 01/30/2016 CLINICAL DATA:  Follow-up posterior circulation stroke. History of hypertension, hyperlipidemia and CABG. EXAM: CT ANGIOGRAPHY HEAD AND NECK TECHNIQUE: Multidetector CT imaging of the head and neck was performed using the standard protocol during bolus administration of intravenous contrast. Multiplanar CT image reconstructions and MIPs were obtained to evaluate the vascular anatomy. Carotid stenosis measurements (when applicable) are obtained utilizing NASCET criteria, using the distal internal carotid diameter as the denominator. CONTRAST:  50 cc Isovue 370 COMPARISON:  MRI of the head January 29, 2016 FINDINGS: CT HEAD BRAIN:  Wedge-like hypodensity RIGHT inferior cerebellum with folia effacement. Mild mass effect on the obex, fourth ventricle remains open. The ventricles and sulci are normal for age. No intraparenchymal hemorrhage, mass effect nor midline shift. Patchy supratentorial white matter hypodensities less than expected for patient's age, though non-specific are most compatible with chronic small vessel ischemic disease. No abnormal extra-axial fluid collections. Basal cisterns are patent. VASCULAR: Severe calcific atherosclerosis of the carotid siphons. Moderate calcific atherosclerosis of bilateral vertebral arteries. SKULL: No skull fracture. No significant scalp soft tissue swelling. SINUSES/ORBITS: The mastoid air-cells and included paranasal sinuses are well-aerated. Status post bilateral ocular lens implants. The included ocular globes and orbital contents are non-suspicious. OTHER: None. CTA NECK AORTIC ARCH: Normal appearance of the thoracic arch, normal branch pattern. Moderate calcific atherosclerosis. Mild stenosis LEFT subclavian artery origin. Common origin of the innominate and LEFT Common carotid artery which is widely patent. RIGHT CAROTID SYSTEM: Common carotid artery is patent, coursing in a straight line fashion. Severe calcific atherosclerosis of the distal Common carotid artery. Normal appearance of the carotid bifurcation without hemodynamically significant stenosis by NASCET criteria. Normal appearance of the included internal carotid artery. LEFT CAROTID SYSTEM:  Common carotid artery is widely patent, coursing in a straight line fashion. Normal appearance of the carotid bifurcation without hemodynamically significant stenosis by NASCET criteria. Moderate eccentric calcific atherosclerosis. Normal appearance of the included internal carotid artery. VERTEBRAL ARTERIES:Codominant vertebral artery's. Normal appearance of the vertebral arteries, which appear widely patent. SKELETON: No acute osseous process  though bone windows have not been submitted. Moderate C5-6 and C6-7 degenerative discs. Severe bilateral C5-6 and C6-7 neural foraminal narrowing. Moderate canal stenosis C5-6. OTHER NECK: Soft tissues of the neck are non-acute though, not tailored for evaluation. Punctate RIGHT parotid sialolith. CTA HEAD ANTERIOR CIRCULATION: Patent bilateral cervical, petrous, cavernous internal carotid arteries with mild stenosis the carotid siphon data heavy vascular calcifications. Bilateral anterior and middle cerebral arteries are widely patent. Mild luminal regularity. No large vessel occlusion, dissection, contrast extravasation or aneurysm. POSTERIOR CIRCULATION: Normal appearance of the vertebral arteries, vertebrobasilar junction and basilar artery. Occluded RIGHT posterior-inferior cerebellar artery approximate 1 cm from the origin. Patent cerebral arteries with moderate tandem stenoses. No dissection, contrast extravasation or aneurysm. VENOUS SINUSES: Major dural venous sinuses are patent though not tailored for evaluation on this angiographic examination. ANATOMIC VARIANTS: None. DELAYED PHASE: No abnormal enhancement. IMPRESSION: CT HEAD: Evolving RIGHT posterior-inferior cerebellar artery territory acute infarct without hemorrhagic conversion. Otherwise negative CT HEAD for age. CTA NECK: Atherosclerosis without hemodynamically significant stenosis or acute vascular process. CTA HEAD: Acute RIGHT posterior-inferior cerebellar artery occlusion within 1 cm of the origin. Moderate tandem stenosis bilateral posterior cerebral arteries compatible with atherosclerosis. Mild anterior circulation atherosclerosis. Severe calcific atherosclerosis of the carotid siphons. Electronically Signed   By: Awilda Metro M.D.   On: 01/30/2016 02:34   Dg Chest 2 View  Result Date: 01/29/2016 CLINICAL DATA:  Weakness EXAM: CHEST  2 VIEW COMPARISON:  05/19/2010 chest radiograph. FINDINGS: Sternotomy wires appear aligned and  intact. Stable cardiomediastinal silhouette with top-normal heart size and aortic atherosclerosis. No pneumothorax. No pleural effusion. No pulmonary edema. No acute consolidative airspace disease. IMPRESSION: No active cardiopulmonary disease. Aortic atherosclerosis. Electronically Signed   By: Delbert Phenix M.D.   On: 01/29/2016 12:51   Ct Angio Neck W Or Clarke Contrast  Result Date: 01/30/2016 CLINICAL DATA:  Follow-up posterior circulation stroke. History of hypertension, hyperlipidemia and CABG. EXAM: CT ANGIOGRAPHY HEAD AND NECK TECHNIQUE: Multidetector CT imaging of the head and neck was performed using the standard protocol during bolus administration of intravenous contrast. Multiplanar CT image reconstructions and MIPs were obtained to evaluate the vascular anatomy. Carotid stenosis measurements (when applicable) are obtained utilizing NASCET criteria, using the distal internal carotid diameter as the denominator. CONTRAST:  50 cc Isovue 370 COMPARISON:  MRI of the head January 29, 2016 FINDINGS: CT HEAD BRAIN: Wedge-like hypodensity RIGHT inferior cerebellum with folia effacement. Mild mass effect on the obex, fourth ventricle remains open. The ventricles and sulci are normal for age. No intraparenchymal hemorrhage, mass effect nor midline shift. Patchy supratentorial white matter hypodensities less than expected for patient's age, though non-specific are most compatible with chronic small vessel ischemic disease. No abnormal extra-axial fluid collections. Basal cisterns are patent. VASCULAR: Severe calcific atherosclerosis of the carotid siphons. Moderate calcific atherosclerosis of bilateral vertebral arteries. SKULL: No skull fracture. No significant scalp soft tissue swelling. SINUSES/ORBITS: The mastoid air-cells and included paranasal sinuses are well-aerated. Status post bilateral ocular lens implants. The included ocular globes and orbital contents are non-suspicious. OTHER: None. CTA NECK  AORTIC ARCH: Normal appearance of the thoracic arch, normal branch pattern. Moderate calcific atherosclerosis. Mild  stenosis LEFT subclavian artery origin. Common origin of the innominate and LEFT Common carotid artery which is widely patent. RIGHT CAROTID SYSTEM: Common carotid artery is patent, coursing in a straight line fashion. Severe calcific atherosclerosis of the distal Common carotid artery. Normal appearance of the carotid bifurcation without hemodynamically significant stenosis by NASCET criteria. Normal appearance of the included internal carotid artery. LEFT CAROTID SYSTEM: Common carotid artery is widely patent, coursing in a straight line fashion. Normal appearance of the carotid bifurcation without hemodynamically significant stenosis by NASCET criteria. Moderate eccentric calcific atherosclerosis. Normal appearance of the included internal carotid artery. VERTEBRAL ARTERIES:Codominant vertebral artery's. Normal appearance of the vertebral arteries, which appear widely patent. SKELETON: No acute osseous process though bone windows have not been submitted. Moderate C5-6 and C6-7 degenerative discs. Severe bilateral C5-6 and C6-7 neural foraminal narrowing. Moderate canal stenosis C5-6. OTHER NECK: Soft tissues of the neck are non-acute though, not tailored for evaluation. Punctate RIGHT parotid sialolith. CTA HEAD ANTERIOR CIRCULATION: Patent bilateral cervical, petrous, cavernous internal carotid arteries with mild stenosis the carotid siphon data heavy vascular calcifications. Bilateral anterior and middle cerebral arteries are widely patent. Mild luminal regularity. No large vessel occlusion, dissection, contrast extravasation or aneurysm. POSTERIOR CIRCULATION: Normal appearance of the vertebral arteries, vertebrobasilar junction and basilar artery. Occluded RIGHT posterior-inferior cerebellar artery approximate 1 cm from the origin. Patent cerebral arteries with moderate tandem stenoses. No  dissection, contrast extravasation or aneurysm. VENOUS SINUSES: Major dural venous sinuses are patent though not tailored for evaluation on this angiographic examination. ANATOMIC VARIANTS: None. DELAYED PHASE: No abnormal enhancement. IMPRESSION: CT HEAD: Evolving RIGHT posterior-inferior cerebellar artery territory acute infarct without hemorrhagic conversion. Otherwise negative CT HEAD for age. CTA NECK: Atherosclerosis without hemodynamically significant stenosis or acute vascular process. CTA HEAD: Acute RIGHT posterior-inferior cerebellar artery occlusion within 1 cm of the origin. Moderate tandem stenosis bilateral posterior cerebral arteries compatible with atherosclerosis. Mild anterior circulation atherosclerosis. Severe calcific atherosclerosis of the carotid siphons. Electronically Signed   By: Awilda Metro M.D.   On: 01/30/2016 02:34   Bruce Clarke Contrast  Result Date: 01/29/2016 CLINICAL DATA:  Dizziness with gait disturbance. Nausea, vomiting, and weakness for 2 days. EXAM: MRI HEAD WITHOUT CONTRAST TECHNIQUE: Multiplanar, multiecho pulse sequences of the brain and surrounding structures were obtained without intravenous contrast. COMPARISON:  05/23/2010 FINDINGS: Brain: There is a large acute right cerebellar PICA territory infarct. There is associated cytotoxic edema without significant posterior fossa mass effect or evidence of associated hemorrhage. There is a punctate acute infarct versus artifact in the right lower pons, not confirmed on coronal imaging. Partially empty sella is unchanged. Chronic microhemorrhages are noted in posterior right frontal cortex and left centrum semiovale. No mass, midline shift, or extra-axial fluid collection is seen. There is age-appropriate cerebral atrophy. Deep cerebral white matter T2 hyperintensities have mildly progressed from 2012 and are nonspecific but compatible with mild chronic small vessel ischemic disease. Vascular: Major intracranial  vascular flow voids are preserved. Skull and upper cervical spine: No suspicious osseous lesion. Sinuses/Orbits: Prior bilateral cataract extraction.  Clear sinuses. Other: None. IMPRESSION: 1. Acute large right cerebellar PICA territory infarct. 2. Punctate acute infarct versus artifact in the pons. 3. Mild chronic small vessel ischemic disease. Electronically Signed   By: Sebastian Ache M.D.   On: 01/29/2016 15:45    Assessment/Plan: Diagnosis: R-PICA occlusion Labs and images independently reviewed.  Records reviewed and summated above. Stroke: Continue secondary stroke prophylaxis and Risk Factor Modification listed below:   Antiplatelet  therapy:   Blood Pressure Management:  Continue current medication with prn's with permisive HTN per primary team Statin Agent:    1. Does the need for close, 24 hr/day medical supervision in concert with the patient's rehab needs make it unreasonable for this patient to be served in a less intensive setting? Yes  2. Co-Morbidities requiring supervision/potential complications: CAD (cont meds), HTN (monitor and provide prns in accordance with increased physical exertion and pain), glaucoma, bradycardia (monitor HR with increased activity and appropriate response), leukocytosis (cont to monitor for signs and symptoms of infection, further workup if indicated) 3. Due to disease management and patient education, does the patient require 24 hr/day rehab nursing? Yes 4. Does the patient require coordinated care of a physician, rehab nurse, PT (1-2 hrs/day, 5 days/week) and OT (1-2 hrs/day, 5 days/week) to address physical and functional deficits in the context of the above medical diagnosis(es)? Yes Addressing deficits in the following areas: balance, endurance, locomotion, strength, transferring, dressing, toileting and psychosocial support 5. Can the patient actively participate in an intensive therapy program of at least 3 hrs of therapy per day at least 5 days per  week? Yes 6. The potential for patient to make measurable gains while on inpatient rehab is excellent 7. Anticipated functional outcomes upon discharge from inpatient rehab are modified independent and supervision  with PT, modified independent with OT, modified independent and supervision with SLP. 8. Estimated rehab length of stay to reach the above functional goals is: 7-12 days. 9. Does the patient have adequate social supports and living environment to accommodate these discharge functional goals? Yes 10. Anticipated D/C setting: Home 11. Anticipated post D/C treatments: HH therapy and Home excercise program 12. Overall Rehab/Functional Prognosis: good  RECOMMENDATIONS: This patient's condition is appropriate for continued rehabilitative care in the following setting: CIR Patient has agreed to participate in recommended program. Yes Note that insurance prior authorization may be required for reimbursement for recommended care.  Comment: Rehab Admissions Coordinator to follow up.  Maryla MorrowAnkit Marquette Blodgett, MD, [place] DomFAAPMR 01/30/2016

## 2016-01-30 NOTE — Evaluation (Addendum)
Occupational Therapy Evaluation Patient Details Name: Bruce Clarke MRN: [id number] DOB: Mar 15, 1917 Today's Date: 01/30/2016    History of Present Illness Bruce Clarke is an 82 y.o. male history of hypertension, hyperlipidemia, coronary artery disease and glaucoma who presented with new onset stable gait and lightheadedness.  MRI positive for with acute right PICA distribution ischemic infarction as well as possible acute pontine infarction    Clinical Impression   Pt admitted with the above diagnoses and presents with below problem list. Pt will benefit from continued acute OT to address the below listed deficits and maximize independence with basic ADLs prior to d/c to venue below. PTA pt was mod I with basic ADLs. Pt is currently min to mod A with LB ADLs and functional transfers/mobility. Pt very motivated, has good insight into deficits, and with good recall of balance strategies given by PT earlier this morning.      Follow Up Recommendations  CIR    Equipment Recommendations  Other (comment) (defer to next venue)    Recommendations for Other Services Rehab consult     Precautions / Restrictions Precautions Precautions: Fall Restrictions Weight Bearing Restrictions: No      Mobility Bed Mobility               General bed mobility comments: up in chair  Transfers Overall transfer level: Needs assistance Equipment used: Rolling walker (2 wheeled) Transfers: Sit to/from Stand Sit to Stand: Min assist;Mod assist         General transfer comment: from recliner and regular height toilet. cues for technique with rw. assist during powerup and to control descent.    Balance Overall balance assessment: Needs assistance Sitting-balance support: No upper extremity supported;Feet supported Sitting balance-Leahy Scale: Fair     Standing balance support: Bilateral upper extremity supported;During functional activity Standing balance-Leahy Scale: Poor Standing  balance comment: BUE support or using sink to stablize self during hand washing. Light min A to steady balance in standing during functional task. R lateral lean.                            ADL Overall ADL's : Needs assistance/impaired Eating/Feeding: Set up;Sitting   Grooming: Wash/dry hands;Min guard;Minimal assistance;Standing Grooming Details (indicate cue type and reason): steadying assist for balancein standing Upper Body Bathing: Set up;Sitting;Supervision/ safety   Lower Body Bathing: Moderate assistance;Sit to/from stand   Upper Body Dressing : Set up;Supervision/safety;Sitting   Lower Body Dressing: Moderate assistance;Sit to/from stand   Toilet Transfer: Moderate assistance;Ambulation;Regular Toilet;RW;Grab bars Toilet Transfer Details (indicate cue type and reason): Cues for technique with rw. Assist during power up and to control descent.  Toileting- Clothing Manipulation and Hygiene: Moderate assistance;Sit to/from stand   Tub/ Engineer, structuralhower Transfer: Moderate assistance   Functional mobility during ADLs: Moderate assistance;Rolling walker;Cueing for safety General ADL Comments: Pt completed in-room functional mobility, toilet transfer and clothing management, and grooming task at sink as detailed above. Pt with noted LE ataxia during OOB ADLs. Pt with good awareness of deficits and able to recall strategies inroduced earlier by PT. Pt leaning into wall on right side while ambulating to bathroom.      Vision     Perception     Praxis      Pertinent Vitals/Pain Pain Assessment: No/denies pain     Hand Dominance Right   Extremity/Trunk Assessment Upper Extremity Assessment Upper Extremity Assessment: Overall WFL for tasks assessed   Lower Extremity Assessment Lower Extremity  Assessment: Defer to PT evaluation   Cervical / Trunk Assessment Cervical / Trunk Assessment: Kyphotic   Communication Communication Communication: HOH   Cognition  Arousal/Alertness: Awake/alert Behavior During Therapy: WFL for tasks assessed/performed Overall Cognitive Status: Within Functional Limits for tasks assessed                     General Comments       Exercises       Shoulder Instructions      Home Living Family/patient expects to be discharged to:: Private residence Living Arrangements: Alone Available Help at Discharge: Family;Available PRN/intermittently (daughter calls every night, pt lives in [place], [place]) Type of Home: House Home Access: Level entry     Home Layout: One level     Bathroom Shower/Tub: Chief Strategy Officer: Standard     Home Equipment: Cane - single point;Walker - 2 wheels;Shower seat;Grab bars - tub/shower;Hand held shower head          Prior Functioning/Environment Level of Independence: Independent with assistive device(s)    ADL's / Homemaking Assistance Needed: assist for laundry and house cleaning twice monthly   Comments: walks with a cane        OT Problem List: Decreased activity tolerance;Impaired balance (sitting and/or standing);Decreased coordination;Decreased knowledge of use of DME or AE;Decreased knowledge of precautions   OT Treatment/Interventions: Self-care/ADL training;Therapeutic exercise;Neuromuscular education;DME and/or AE instruction;Therapeutic activities;Patient/family education;Balance training    OT Goals(Current goals can be found in the care plan section) Acute Rehab OT Goals Patient Stated Goal: To go to rehab then daughter's home OT Goal Formulation: With patient Time For Goal Achievement: 02/13/16 Potential to Achieve Goals: Good ADL Goals Pt Will Perform Grooming: with supervision;standing Pt Will Perform Lower Body Bathing: with min guard assist;sit to/from stand Pt Will Perform Lower Body Dressing: with min guard assist;sit to/from stand Pt Will Transfer to Toilet: with min guard assist;ambulating Pt Will Perform Toileting  - Clothing Manipulation and hygiene: with min guard assist;sit to/from stand Pt Will Perform Tub/Shower Transfer: with min guard assist;ambulating;shower seat;rolling walker  OT Frequency: Min 2X/week   Barriers to D/C:            Co-evaluation              End of Session Equipment Utilized During Treatment: Gait belt;Rolling walker Nurse Communication: Mobility status  Activity Tolerance: Patient tolerated treatment well Patient left: in chair;with call bell/phone within reach;with chair alarm set   Time: 1249-1315 OT Time Calculation (min): 26 min Charges:  OT General Charges $OT Visit: 1 Procedure OT Evaluation $OT Eval Moderate Complexity: 1 Procedure OT Treatments $Self Care/Home Management : 8-22 mins G-Codes:    Pilar Grammes 02-02-2016, 1:26 PM

## 2016-01-31 ENCOUNTER — Inpatient Hospital Stay (HOSPITAL_COMMUNITY): Payer: Medicare Other

## 2016-01-31 DIAGNOSIS — I6789 Other cerebrovascular disease: Secondary | ICD-10-CM

## 2016-01-31 LAB — ECHOCARDIOGRAM COMPLETE
Height: 68 in
WEIGHTICAEL: 2684.32 [oz_av]

## 2016-01-31 MED ORDER — SENNA 8.6 MG PO TABS
1.0000 | ORAL_TABLET | Freq: Every day | ORAL | Status: DC | PRN
  Administered 2016-01-31 – 2016-02-01 (×2): 17.2 mg via ORAL
  Filled 2016-01-31: qty 1
  Filled 2016-01-31 (×2): qty 2

## 2016-01-31 NOTE — Progress Notes (Signed)
Insurance has denied approval for an inpt rehab admission. I spoke with pt and then contacted his daughter by phone. She is requesting appeal and I provided her the number to appeal [facility] [employer] [facility], not [employer]. [phone] ref# [id number]. I will notify MD, RN CM and SW. We will follow up tomorrow. [phone]

## 2016-01-31 NOTE — Progress Notes (Signed)
I await insurance decision on possible admission to inpt rehab today. [phone]

## 2016-01-31 NOTE — Progress Notes (Signed)
Physical Therapy Treatment Patient Details Name: Bruce Gumdwin Sebesta MRN: [id number] DOB: 1916/08/28 Today's Date: 01/31/2016    History of Present Illness Bruce Clarke is an 82 y.o. male history of hypertension, hyperlipidemia, coronary artery disease and glaucoma who presented with new onset stable gait and lightheadedness.  MRI positive for with acute right PICA distribution ischemic infarction as well as possible acute pontine infarction     PT Comments    Patient progressing with balance and endurance this session, walked farther without seated rest.  Still tired after standing balance work with decreased ability to follow commands for standing balance task once fatigued.  Will need CIR level therapies at d/c.   Follow Up Recommendations  CIR     Equipment Recommendations  None recommended by PT    Recommendations for Other Services       Precautions / Restrictions Precautions Precautions: Fall    Mobility  Bed Mobility Overal bed mobility: Needs Assistance Bed Mobility: Supine to Sit;Sit to Supine     Supine to sit: Supervision;HOB elevated Sit to supine: Supervision   General bed mobility comments: increased time; cues and bed in trendelenberg [facility]  Transfers Overall transfer level: Needs assistance Equipment used: Rolling walker (2 wheeled) Transfers: Sit to/from Stand Sit to Stand: Min assist         General transfer comment: cues for anterior weight shift due to initial posterior bias  Ambulation/Gait Ambulation/Gait assistance: Min assist;Mod assist Ambulation Distance (Feet): 150 Feet Assistive device: Rolling walker (2 wheeled) Gait Pattern/deviations: Step-through pattern;Trunk flexed;Decreased stride length     General Gait Details: leaning forward and rushing in hallway with walker; assist for balance due to anterior leaning and at times R leaning. in room assist for walker around obstacles   Stairs             Wheelchair Mobility    Modified Rankin (Stroke Patients Only) Modified Rankin (Stroke Patients Only) Pre-Morbid Rankin Score: No symptoms Modified Rankin: Moderately severe disability     Balance Overall balance assessment: Needs assistance         Standing balance support: Bilateral upper extremity supported Standing balance-Leahy Scale: Poor Standing balance comment: attempts at standing unsupported with close S only about 19 seconds prior LOB anterior, to R or posterior depending on where support surface is.                High Level Balance Comments: at sink patient holding on with single extremity performed anterior step taps with min A, then heel raises with bilateral UE support, mini squats and then attempted back step taps with difficulty following through noting swinging legs back    Cognition Arousal/Alertness: Awake/alert Behavior During Therapy: WFL for tasks assessed/performed Overall Cognitive Status: Within Functional Limits for tasks assessed                      Exercises      General Comments General comments (skin integrity, edema, etc.): c/o nausea and daughter in room feels possibly due to not having BM in few days, pt toileted in bathroom sitting on low toilet no armrests without LOB, but needing mod assist to stand      Pertinent Vitals/Pain Pain Assessment: No/denies pain    Home Living                      Prior Function            PT Goals (  current goals can now be found in the care plan section) Progress towards PT goals: Progressing toward goals    Frequency    Min 4X/week      PT Plan Current plan remains appropriate    Co-evaluation             End of Session Equipment Utilized During Treatment: Gait belt Activity Tolerance: Patient tolerated treatment well Patient left: in bed;with call bell/phone within reach;with family/visitor present;with bed alarm set     Time: 1333-1401 PT Time  Calculation (min) (ACUTE ONLY): 28 min  Charges:  $Gait Training: 8-22 mins $Therapeutic Activity: 8-22 mins                    G Codes:      Elray Mcgregor 2016-02-23, 3:47 PM  Sheran Lawless, PT [phone] 2016/02/23

## 2016-01-31 NOTE — Progress Notes (Signed)
  Echocardiogram 2D Echocardiogram has been performed.  Bruce Clarke, Tony 01/31/2016, 4:17 PM

## 2016-01-31 NOTE — Progress Notes (Signed)
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 On arrival he exhibited mild HTN and ataxia. MRI brain demonstrated an acute large right cerebellar PICA territory infarct with punctate acute infarct versus artifact in the pons. Neurology recommended admission to [facility] for ongoing work up and management by stroke team. Work up has not revealed etiology of likely embolic strokes. Therapy has worked with him and recommends CIR for this otherwise very well elderly gentleman.   Assessment & Plan: Principal Problem:   Cerebellar stroke (HCC) Active Problems:   Stroke (cerebrum) (HCC)   Dehydration   Benign essential HTN   Hyperlipidemia   CAD (coronary artery disease) of bypass graft   Glaucoma   Acute CVA (cerebrovascular accident) (HCC)   Memory deficit after cerebral infarction   Bradycardia   Leukocytosis  Acute cerebellar stroke: Also with possible infarct in pons suggestive of superior cerebellar artery (in addition to PICA) territory stroke consistent with embolic source, with residual truncal ataxia and nausea. PT and OT recommending CIR. CIR has been consulted.  -  Telemetry shows no AFib: Have called cardiology who will set up 30-day event monitor at discharge.  -  CTA head and neck: No significant stenosis -  Echocardiogram performed, results pending -  Aspirin 81mg  + plavix 75mg  daily x3 months, then plavix alone -  LDL at goal, 59. HbA1c 5.8%. -  Stroke team following  Dehydration: Resolved. - D/C IVF  Hypertension: Chronic, stable. -  Continue lisinopril  Hyperlipidemia -  Continue simvastatin  CAD with hx of CABG, LBBB, stable angina -   Continue aspirin, statin, and ACEI.  Not on beta blocker due to bradycardia -  Continue NTG  Chronic diastolic heart failure, appears euvolemic -  Continue lasix 40mg  daily  Glaucoma: Chronic, stable -  Continue bimatoprost annd combigan  DVT prophylaxis: Lovenox Code Status: Partial code reviewed at admission: If heart stops beating, no CP, shocks, medications, but would want ICU, ventilator, vasopressors, and medications for arrhythmias.   Family Communication: Discussed with daughter, Fleet Contras. Disposition Plan: Continue stroke work up, to [employer] [employer] pending expedited appeal of insurance. D/C likely in next 24 hours.  Consultants:   Stroke team  Procedures:   Echo pending  Antimicrobials:  None   Subjective: Pt without complaints. Feels well this morning, still with easy fatigue when working with PT and imbalance.  Objective: Vitals:   01/31/16 0536 01/31/16 0916 01/31/16 1033 01/31/16 1453  BP: (!) 159/68 (!) 182/69 (!) 162/50 (!) 164/56  Pulse: 73 62 (!) 47 (!) 48  Resp: 18  18 18   Temp: 97.7 F (36.5 C) 98.1 F (36.7 C) 97.7 F (36.5 C) 97.5 F (36.4 C)  TempSrc: Oral Oral Oral Oral  SpO2: 95% 96%  94%  Weight:      Height:        Intake/Output Summary (Last 24 hours) at 01/31/16 1701 Last data filed at 01/31/16 0947  Gross per 24 hour  Intake              240 ml  Output             1050 ml  Net             -  810 ml   Filed Weights   01/29/16 1851 01/29/16 2000  Weight: 77.1 kg (170 lb) 76.1 kg (167 lb 12.3 oz)    Examination: General exam: 82 y.o. male in no distress Respiratory system: Non-labored breathing. Clear to auscultation bilaterally.  Cardiovascular system: Regular rate and rhythm. No murmur, rub, or gallop. No JVD, and no pedal edema. Gastrointestinal system: Abdomen soft, non-tender, non-distended, with normoactive bowel sounds. No organomegaly or masses felt. Central nervous system: Alert and oriented. Strength 5/5 in extremities. Able to sit  up unassisted with some imbalance. Extremities: Warm, no deformities Skin: No rashes, lesions no ulcers Psychiatry: Judgement and insight appear normal. Mood & affect appropriate.   Data Reviewed: I have personally reviewed following labs and imaging studies  CBC:  Recent Labs Lab 01/29/16 0954  WBC 12.2*  HGB 14.3  HCT 42.7  MCV 91.6  PLT 211   Basic Metabolic Panel:  Recent Labs Lab 01/29/16 0954  NA 139  K 4.4  CL 101  CO2 29  GLUCOSE 131*  BUN 20  CREATININE 0.90  CALCIUM 9.3   GFR: Estimated Creatinine Clearance: 46.4 mL/min (by C-G formula based on SCr of 0.9 mg/dL). Liver Function Tests:  Recent Labs Lab 01/29/16 0954  AST 21  ALT 17  ALKPHOS 54  BILITOT 1.2  PROT 6.8  ALBUMIN 4.1    Recent Labs Lab 01/29/16 0954  LIPASE 20   No results for input(s): AMMONIA in the last 168 hours. Coagulation Profile: No results for input(s): INR, PROTIME in the last 168 hours. Cardiac Enzymes: No results for input(s): CKTOTAL, CKMB, CKMBINDEX, TROPONINI in the last 168 hours. BNP (last 3 results) No results for input(s): PROBNP in the last 8760 hours. HbA1C:  Recent Labs  01/30/16 0636  HGBA1C 5.8*   CBG: No results for input(s): GLUCAP in the last 168 hours. Lipid Profile:  Recent Labs  01/30/16 0636  CHOL 126  HDL 34*  LDLCALC 59  TRIG 161*  CHOLHDL 3.7   Thyroid Function Tests: No results for input(s): TSH, T4TOTAL, FREET4, T3FREE, THYROIDAB in the last 72 hours. Anemia Panel: No results for input(s): VITAMINB12, FOLATE, FERRITIN, TIBC, IRON, RETICCTPCT in the last 72 hours. Urine analysis:    Component Value Date/Time   COLORURINE YELLOW 01/29/2016 0954   APPEARANCEUR CLEAR 01/29/2016 0954   LABSPEC 1.025 01/29/2016 0954   PHURINE 5.5 01/29/2016 0954   GLUCOSEU NEGATIVE 01/29/2016 0954   HGBUR NEGATIVE 01/29/2016 0954   BILIRUBINUR NEGATIVE 01/29/2016 0954   KETONESUR NEGATIVE 01/29/2016 0954   PROTEINUR NEGATIVE 01/29/2016 0954    UROBILINOGEN 1.0 05/19/2010 2127   NITRITE NEGATIVE 01/29/2016 0954   LEUKOCYTESUR NEGATIVE 01/29/2016 0954   Sepsis Labs: @LABRCNTIP (procalcitonin:4,lacticidven:4)  )No results found for this or any previous visit (from the past 240 hour(s)).   Radiology Studies: Ct Angio Head W Or Wo Contrast  Result Date: 01/30/2016 CLINICAL DATA:  Follow-up posterior circulation stroke. History of hypertension, hyperlipidemia and CABG. EXAM: CT ANGIOGRAPHY HEAD AND NECK TECHNIQUE: Multidetector CT imaging of the head and neck was performed using the standard protocol during bolus administration of intravenous contrast. Multiplanar CT image reconstructions and MIPs were obtained to evaluate the vascular anatomy. Carotid stenosis measurements (when applicable) are obtained utilizing NASCET criteria, using the distal internal carotid diameter as the denominator. CONTRAST:  50 cc Isovue 370 COMPARISON:  MRI of the head January 29, 2016 FINDINGS: CT HEAD BRAIN: Wedge-like hypodensity RIGHT inferior cerebellum with folia effacement. Mild mass effect on the obex, fourth  ventricle remains open. The ventricles and sulci are normal for age. No intraparenchymal hemorrhage, mass effect nor midline shift. Patchy supratentorial white matter hypodensities less than expected for patient's age, though non-specific are most compatible with chronic small vessel ischemic disease. No abnormal extra-axial fluid collections. Basal cisterns are patent. VASCULAR: Severe calcific atherosclerosis of the carotid siphons. Moderate calcific atherosclerosis of bilateral vertebral arteries. SKULL: No skull fracture. No significant scalp soft tissue swelling. SINUSES/ORBITS: The mastoid air-cells and included paranasal sinuses are well-aerated. Status post bilateral ocular lens implants. The included ocular globes and orbital contents are non-suspicious. OTHER: None. CTA NECK AORTIC ARCH: Normal appearance of the thoracic arch, normal branch  pattern. Moderate calcific atherosclerosis. Mild stenosis LEFT subclavian artery origin. Common origin of the innominate and LEFT Common carotid artery which is widely patent. RIGHT CAROTID SYSTEM: Common carotid artery is patent, coursing in a straight line fashion. Severe calcific atherosclerosis of the distal Common carotid artery. Normal appearance of the carotid bifurcation without hemodynamically significant stenosis by NASCET criteria. Normal appearance of the included internal carotid artery. LEFT CAROTID SYSTEM: Common carotid artery is widely patent, coursing in a straight line fashion. Normal appearance of the carotid bifurcation without hemodynamically significant stenosis by NASCET criteria. Moderate eccentric calcific atherosclerosis. Normal appearance of the included internal carotid artery. VERTEBRAL ARTERIES:Codominant vertebral artery's. Normal appearance of the vertebral arteries, which appear widely patent. SKELETON: No acute osseous process though bone windows have not been submitted. Moderate C5-6 and C6-7 degenerative discs. Severe bilateral C5-6 and C6-7 neural foraminal narrowing. Moderate canal stenosis C5-6. OTHER NECK: Soft tissues of the neck are non-acute though, not tailored for evaluation. Punctate RIGHT parotid sialolith. CTA HEAD ANTERIOR CIRCULATION: Patent bilateral cervical, petrous, cavernous internal carotid arteries with mild stenosis the carotid siphon data heavy vascular calcifications. Bilateral anterior and middle cerebral arteries are widely patent. Mild luminal regularity. No large vessel occlusion, dissection, contrast extravasation or aneurysm. POSTERIOR CIRCULATION: Normal appearance of the vertebral arteries, vertebrobasilar junction and basilar artery. Occluded RIGHT posterior-inferior cerebellar artery approximate 1 cm from the origin. Patent cerebral arteries with moderate tandem stenoses. No dissection, contrast extravasation or aneurysm. VENOUS SINUSES: Major  dural venous sinuses are patent though not tailored for evaluation on this angiographic examination. ANATOMIC VARIANTS: None. DELAYED PHASE: No abnormal enhancement. IMPRESSION: CT HEAD: Evolving RIGHT posterior-inferior cerebellar artery territory acute infarct without hemorrhagic conversion. Otherwise negative CT HEAD for age. CTA NECK: Atherosclerosis without hemodynamically significant stenosis or acute vascular process. CTA HEAD: Acute RIGHT posterior-inferior cerebellar artery occlusion within 1 cm of the origin. Moderate tandem stenosis bilateral posterior cerebral arteries compatible with atherosclerosis. Mild anterior circulation atherosclerosis. Severe calcific atherosclerosis of the carotid siphons. Electronically Signed   By: Awilda Metro M.D.   On: 01/30/2016 02:34   Ct Angio Neck W Or Wo Contrast  Result Date: 01/30/2016 CLINICAL DATA:  Follow-up posterior circulation stroke. History of hypertension, hyperlipidemia and CABG. EXAM: CT ANGIOGRAPHY HEAD AND NECK TECHNIQUE: Multidetector CT imaging of the head and neck was performed using the standard protocol during bolus administration of intravenous contrast. Multiplanar CT image reconstructions and MIPs were obtained to evaluate the vascular anatomy. Carotid stenosis measurements (when applicable) are obtained utilizing NASCET criteria, using the distal internal carotid diameter as the denominator. CONTRAST:  50 cc Isovue 370 COMPARISON:  MRI of the head January 29, 2016 FINDINGS: CT HEAD BRAIN: Wedge-like hypodensity RIGHT inferior cerebellum with folia effacement. Mild mass effect on the obex, fourth ventricle remains open. The ventricles and sulci are normal for  age. No intraparenchymal hemorrhage, mass effect nor midline shift. Patchy supratentorial white matter hypodensities less than expected for patient's age, though non-specific are most compatible with chronic small vessel ischemic disease. No abnormal extra-axial fluid collections.  Basal cisterns are patent. VASCULAR: Severe calcific atherosclerosis of the carotid siphons. Moderate calcific atherosclerosis of bilateral vertebral arteries. SKULL: No skull fracture. No significant scalp soft tissue swelling. SINUSES/ORBITS: The mastoid air-cells and included paranasal sinuses are well-aerated. Status post bilateral ocular lens implants. The included ocular globes and orbital contents are non-suspicious. OTHER: None. CTA NECK AORTIC ARCH: Normal appearance of the thoracic arch, normal branch pattern. Moderate calcific atherosclerosis. Mild stenosis LEFT subclavian artery origin. Common origin of the innominate and LEFT Common carotid artery which is widely patent. RIGHT CAROTID SYSTEM: Common carotid artery is patent, coursing in a straight line fashion. Severe calcific atherosclerosis of the distal Common carotid artery. Normal appearance of the carotid bifurcation without hemodynamically significant stenosis by NASCET criteria. Normal appearance of the included internal carotid artery. LEFT CAROTID SYSTEM: Common carotid artery is widely patent, coursing in a straight line fashion. Normal appearance of the carotid bifurcation without hemodynamically significant stenosis by NASCET criteria. Moderate eccentric calcific atherosclerosis. Normal appearance of the included internal carotid artery. VERTEBRAL ARTERIES:Codominant vertebral artery's. Normal appearance of the vertebral arteries, which appear widely patent. SKELETON: No acute osseous process though bone windows have not been submitted. Moderate C5-6 and C6-7 degenerative discs. Severe bilateral C5-6 and C6-7 neural foraminal narrowing. Moderate canal stenosis C5-6. OTHER NECK: Soft tissues of the neck are non-acute though, not tailored for evaluation. Punctate RIGHT parotid sialolith. CTA HEAD ANTERIOR CIRCULATION: Patent bilateral cervical, petrous, cavernous internal carotid arteries with mild stenosis the carotid siphon data heavy  vascular calcifications. Bilateral anterior and middle cerebral arteries are widely patent. Mild luminal regularity. No large vessel occlusion, dissection, contrast extravasation or aneurysm. POSTERIOR CIRCULATION: Normal appearance of the vertebral arteries, vertebrobasilar junction and basilar artery. Occluded RIGHT posterior-inferior cerebellar artery approximate 1 cm from the origin. Patent cerebral arteries with moderate tandem stenoses. No dissection, contrast extravasation or aneurysm. VENOUS SINUSES: Major dural venous sinuses are patent though not tailored for evaluation on this angiographic examination. ANATOMIC VARIANTS: None. DELAYED PHASE: No abnormal enhancement. IMPRESSION: CT HEAD: Evolving RIGHT posterior-inferior cerebellar artery territory acute infarct without hemorrhagic conversion. Otherwise negative CT HEAD for age. CTA NECK: Atherosclerosis without hemodynamically significant stenosis or acute vascular process. CTA HEAD: Acute RIGHT posterior-inferior cerebellar artery occlusion within 1 cm of the origin. Moderate tandem stenosis bilateral posterior cerebral arteries compatible with atherosclerosis. Mild anterior circulation atherosclerosis. Severe calcific atherosclerosis of the carotid siphons. Electronically Signed   By: Awilda Metro M.D.   On: 01/30/2016 02:34    Scheduled Meds: . aspirin EC  81 mg Oral Daily  . brimonidine  1 drop Both Eyes Q12H   And  . timolol  1 drop Both Eyes Q12H  . cholecalciferol  1,000 Units Oral Daily  . clopidogrel  75 mg Oral Daily  . docusate sodium  100 mg Oral BID  . enoxaparin (LOVENOX) injection  40 mg Subcutaneous Q24H  . finasteride  5 mg Oral Daily  . furosemide  40 mg Oral Daily  . latanoprost  1 drop Both Eyes QHS  . lisinopril  5 mg Oral Daily  . potassium chloride  10 mEq Oral Daily  . simvastatin  20 mg Oral Daily  . vitamin B-12  100 mcg Oral Daily   Continuous Infusions:     LOS: 2 days  Time spent: 15  minutes.  Hazeline Junkeryan Nihira Puello, MD [facility] Pager [phone]  If 7PM-7AM, please contact night-coverage [email or web address] Password [email or web address] 01/31/2016, 5:01 PM

## 2016-02-01 MED ORDER — POLYETHYLENE GLYCOL 3350 17 G PO PACK
17.0000 g | PACK | Freq: Once | ORAL | Status: AC
  Administered 2016-02-01: 17 g via ORAL
  Filled 2016-02-01 (×2): qty 1

## 2016-02-01 NOTE — Clinical Social Work Note (Signed)
Clinical Social Work Assessment  Patient Details  Name: Bruce Clarke MRN: [id number] Date of Birth: 12-Jun-1916  Date of referral:                  Reason for consult:  Discharge Planning                Permission sought to share information with:  Facility Medical sales representativeContact Representative, Family Supports Permission granted to share information::  Yes, Verbal Permission Granted  Name::     Catering managerYarbrough,Bruce  Agency::  [employer]  Relationship::  Daughter  Contact Information:     Housing/Transportation Living arrangements for the past 2 months:  Single Family Home Source of Information:  Adult Children Patient Interpreter Needed:  None Criminal Activity/Legal Involvement Pertinent to Current Situation/Hospitalization:  No - Comment as needed Significant Relationships:  Adult Children Lives with:  Self Do you feel safe going back to the place where you live?  Yes Need for family participation in patient care:  Yes (Comment)  Care giving concerns:  Patient's daughter would like patient to participate in short term rehab to rebuild his strength to return home.   Social Worker assessment / plan: CSW explained PT recommendation for CIR placement. CSW explained that an alternate plan of SNFs placement may be needed.  CSW explained SNF search and placement process to the  patient's daughter and answered her questions. The  patient's daughter requested  CSW to pursue alternate plan of SNFs placement. [facility] CSW will follow up with bed offers.  Employment status:  Retired Database administratornsurance information:  Managed Medicare PT Recommendations:  Inpatient Rehab Consult Information / Referral to community resources:  Skilled Nursing Facility  Patient/Family's Response to care:  The patient's daughter is happy with the care the patient has received.   Patient/Family's Understanding of and Emotional Response to Diagnosis, Current Treatment, and Prognosis: The patient's  has a good understanding of why the patient  was admitted. She understands the care plan and what the patient will need post discharge.  Emotional Assessment Appearance:   (Unable to assess) Attitude/Demeanor/Rapport:    Affect (typically observed):  Unable to Assess Orientation:   (Unable to assess) Alcohol / Substance use:  Not Applicable Psych involvement (Current and /or in the community):  No (Comment)  Discharge Needs  Concerns to be addressed:  Discharge Planning Concerns Readmission within the last 30 days:  No Current discharge risk:  Physical Impairment Barriers to Discharge:  Continued Medical Work up   Electronic Data SystemsLaShonda A Koni Kannan, LCSW 02/01/2016, 3:09 PM

## 2016-02-01 NOTE — Progress Notes (Signed)
[facility]                                                                              Patient Demographics  Bruce Clarke, is a 82 y.o. male, DOB - 1916-07-16, ZOX:[id number]  Admit date - 01/29/2016   Admitting Physician Renae Fickle, MD  Outpatient Primary MD for the patient is No PCP Per Patient  Outpatient specialists:   LOS - 3  days    Chief Complaint  Patient presents with  . Weakness  . 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 On arrival he exhibited mild HTN and ataxia.MRI brain demonstrated an acute large right cerebellar PICA territory infarct with punctate acute infarct versus artifact in the pons.Neurology recommended admission to [facility] for ongoing work up and management by stroke team. Work up has not revealed etiology of likely embolic strokes. Therapy has worked with him and recommends CIR.    Assessment & Plan   Acute cerebellarstroke: Also with possible infarct in pons suggestive of superior cerebellar artery (in addition to PICA) territory stroke consistent with embolic source, with residual truncal ataxia and nausea. - MRI of the brain showed acute large right cerebellar PICA infarct, punctate acute infarct versus artifact in the pons. Mild chronic small vessel ischemic disease -  PT and OT recommending CIR. CIR has been consulted.  - Telemetry showed no AFib: Dr Jarvis Newcomer called cardiology who will set up 30-day event monitor at discharge.  - CTA head and neck: No significant stenosis - EchocardiogramShowed EF of 50%, basal to mid inferior hypokinesis with grade 1 diastolic dysfunction.  - Per neurology, Aspirin 81mg  + plavix 75mg  daily x3 months, then plavix alone - LDL at goal, 59.HbA1c 5.8%.   Dehydration:  Resolved.  Hypertension: Chronic, stable. - Continue lisinopril  Hyperlipidemia - Continue simvastatin  CAD with hx of CABG, LBBB, stable angina - Continue aspirin, statin, and ACEI. Not on beta blocker due to bradycardia - Continue NTG  Chronic diastolic heart failure,appears euvolemic - Continue lasix 40mg  daily  Glaucoma: Chronic, stable - Continue bimatoprost annd combigan    Code Status: Partial If heart stops beating, no CP, shocks, medications, but would want ICU, ventilator, vasopressors, and medications for arrhythmias.  DVT Prophylaxis:  lovenox Family Communication: Discussed in detail with the patient, all imaging results, lab results explained to the patient  Disposition Plan: awaiting CIR   Time Spent in minutes 15 minutes  Procedures:  MRI brain, 2-D echo, CT angiogram head and neck  Consultants:   Neurology  Antimicrobials:      Medications  Scheduled Meds: . aspirin EC  81 mg Bruce Daily  . brimonidine  1 drop Both Eyes Q12H   And  . timolol  1 drop Both Eyes Q12H  . cholecalciferol  1,000 Units Bruce Daily  . clopidogrel  75 mg Bruce Daily  . docusate sodium  100 mg Bruce BID  . enoxaparin (LOVENOX) injection  40 mg  Subcutaneous Q24H  . finasteride  5 mg Bruce Daily  . furosemide  40 mg Bruce Daily  . latanoprost  1 drop Both Eyes QHS  . lisinopril  5 mg Bruce Daily  . potassium chloride  10 mEq Bruce Daily  . simvastatin  20 mg Bruce Daily  . vitamin B-12  100 mcg Bruce Daily   Continuous Infusions:  PRN Meds:.hydrALAZINE, loratadine, nitroGLYCERIN, ondansetron (ZOFRAN) IV, promethazine, senna   Antibiotics   Anti-infectives    None        Subjective:   Bruce Clarke was seen and examined today.  Patient denies dizziness, chest pain, shortness of breath, abdominal pain, N/V/D/C, new weakness, numbess, tingling. No acute events overnight.    Objective:   Vitals:   01/31/16 2143 02/01/16 0135 02/01/16 0626 02/01/16 1019   BP: (!) 160/53 (!) 173/55 (!) 194/57 (!) 146/50  Pulse: (!) 53 (!) 50 (!) 55 (!) 57  Resp: 18 18 18 18   Temp: 97.5 F (36.4 C) 97.9 F (36.6 C) 98 F (36.7 C) 98.6 F (37 C)  TempSrc: Bruce Bruce Bruce Bruce  SpO2: 97% 97% 98% 98%  Weight:      Height:        Intake/Output Summary (Last 24 hours) at 02/01/16 1221 Last data filed at 02/01/16 0900  Gross per 24 hour  Intake              240 ml  Output                0 ml  Net              240 ml     Wt Readings from Last 3 Encounters:  01/29/16 76.1 kg (167 lb 12.3 oz)     Exam  General: Alert and oriented x 3, NAD  HEENT:  PERRLA, EOMI, Anicteric Sclera, mucous membranes moist.   Neck: Supple, no JVD, no masses  Cardiovascular: S1 S2 auscultated, no rubs, murmurs or gallops. Regular rate and rhythm.  Respiratory: Clear to auscultation bilaterally, no wheezing, rales or rhonchi  Gastrointestinal: Soft, nontender, nondistended, + bowel sounds  Ext: no cyanosis clubbing or edema  Neuro: AAOx3, Cr N's II- XII. Strength 5/5 upper and lower extremities bilaterally  Skin: No rashes  Psych: Normal affect and demeanor, alert and oriented x3    Data Reviewed:  I have personally reviewed following labs and imaging studies  Micro Results No results found for this or any previous visit (from the past 240 hour(s)).  Radiology Reports Ct Angio Head W Or Wo Contrast  Result Date: 01/30/2016 CLINICAL DATA:  Follow-up posterior circulation stroke. History of hypertension, hyperlipidemia and CABG. EXAM: CT ANGIOGRAPHY HEAD AND NECK TECHNIQUE: Multidetector CT imaging of the head and neck was performed using the standard protocol during bolus administration of intravenous contrast. Multiplanar CT image reconstructions and MIPs were obtained to evaluate the vascular anatomy. Carotid stenosis measurements (when applicable) are obtained utilizing NASCET criteria, using the distal internal carotid diameter as the denominator.  CONTRAST:  50 cc Isovue 370 COMPARISON:  MRI of the head January 29, 2016 FINDINGS: CT HEAD BRAIN: Wedge-like hypodensity RIGHT inferior cerebellum with folia effacement. Mild mass effect on the obex, fourth ventricle remains open. The ventricles and sulci are normal for age. No intraparenchymal hemorrhage, mass effect nor midline shift. Patchy supratentorial white matter hypodensities less than expected for patient's age, though non-specific are most compatible with chronic small vessel ischemic disease. No abnormal extra-axial fluid collections. Basal  cisterns are patent. VASCULAR: Severe calcific atherosclerosis of the carotid siphons. Moderate calcific atherosclerosis of bilateral vertebral arteries. SKULL: No skull fracture. No significant scalp soft tissue swelling. SINUSES/ORBITS: The mastoid air-cells and included paranasal sinuses are well-aerated. Status post bilateral ocular lens implants. The included ocular globes and orbital contents are non-suspicious. OTHER: None. CTA NECK AORTIC ARCH: Normal appearance of the thoracic arch, normal branch pattern. Moderate calcific atherosclerosis. Mild stenosis LEFT subclavian artery origin. Common origin of the innominate and LEFT Common carotid artery which is widely patent. RIGHT CAROTID SYSTEM: Common carotid artery is patent, coursing in a straight line fashion. Severe calcific atherosclerosis of the distal Common carotid artery. Normal appearance of the carotid bifurcation without hemodynamically significant stenosis by NASCET criteria. Normal appearance of the included internal carotid artery. LEFT CAROTID SYSTEM: Common carotid artery is widely patent, coursing in a straight line fashion. Normal appearance of the carotid bifurcation without hemodynamically significant stenosis by NASCET criteria. Moderate eccentric calcific atherosclerosis. Normal appearance of the included internal carotid artery. VERTEBRAL ARTERIES:Codominant vertebral artery's. Normal  appearance of the vertebral arteries, which appear widely patent. SKELETON: No acute osseous process though bone windows have not been submitted. Moderate C5-6 and C6-7 degenerative discs. Severe bilateral C5-6 and C6-7 neural foraminal narrowing. Moderate canal stenosis C5-6. OTHER NECK: Soft tissues of the neck are non-acute though, not tailored for evaluation. Punctate RIGHT parotid sialolith. CTA HEAD ANTERIOR CIRCULATION: Patent bilateral cervical, petrous, cavernous internal carotid arteries with mild stenosis the carotid siphon data heavy vascular calcifications. Bilateral anterior and middle cerebral arteries are widely patent. Mild luminal regularity. No large vessel occlusion, dissection, contrast extravasation or aneurysm. POSTERIOR CIRCULATION: Normal appearance of the vertebral arteries, vertebrobasilar junction and basilar artery. Occluded RIGHT posterior-inferior cerebellar artery approximate 1 cm from the origin. Patent cerebral arteries with moderate tandem stenoses. No dissection, contrast extravasation or aneurysm. VENOUS SINUSES: Major dural venous sinuses are patent though not tailored for evaluation on this angiographic examination. ANATOMIC VARIANTS: None. DELAYED PHASE: No abnormal enhancement. IMPRESSION: CT HEAD: Evolving RIGHT posterior-inferior cerebellar artery territory acute infarct without hemorrhagic conversion. Otherwise negative CT HEAD for age. CTA NECK: Atherosclerosis without hemodynamically significant stenosis or acute vascular process. CTA HEAD: Acute RIGHT posterior-inferior cerebellar artery occlusion within 1 cm of the origin. Moderate tandem stenosis bilateral posterior cerebral arteries compatible with atherosclerosis. Mild anterior circulation atherosclerosis. Severe calcific atherosclerosis of the carotid siphons. Electronically Signed   By: Awilda Metroourtnay  Bloomer M.D.   On: 01/30/2016 02:34   Dg Chest 2 View  Result Date: 01/29/2016 CLINICAL DATA:  Weakness EXAM: CHEST   2 VIEW COMPARISON:  05/19/2010 chest radiograph. FINDINGS: Sternotomy wires appear aligned and intact. Stable cardiomediastinal silhouette with top-normal heart size and aortic atherosclerosis. No pneumothorax. No pleural effusion. No pulmonary edema. No acute consolidative airspace disease. IMPRESSION: No active cardiopulmonary disease. Aortic atherosclerosis. Electronically Signed   By: Delbert PhenixJason A Poff M.D.   On: 01/29/2016 12:51   Ct Angio Neck W Or Wo Contrast  Result Date: 01/30/2016 CLINICAL DATA:  Follow-up posterior circulation stroke. History of hypertension, hyperlipidemia and CABG. EXAM: CT ANGIOGRAPHY HEAD AND NECK TECHNIQUE: Multidetector CT imaging of the head and neck was performed using the standard protocol during bolus administration of intravenous contrast. Multiplanar CT image reconstructions and MIPs were obtained to evaluate the vascular anatomy. Carotid stenosis measurements (when applicable) are obtained utilizing NASCET criteria, using the distal internal carotid diameter as the denominator. CONTRAST:  50 cc Isovue 370 COMPARISON:  MRI of the head January 29, 2016 FINDINGS:  CT HEAD BRAIN: Wedge-like hypodensity RIGHT inferior cerebellum with folia effacement. Mild mass effect on the obex, fourth ventricle remains open. The ventricles and sulci are normal for age. No intraparenchymal hemorrhage, mass effect nor midline shift. Patchy supratentorial white matter hypodensities less than expected for patient's age, though non-specific are most compatible with chronic small vessel ischemic disease. No abnormal extra-axial fluid collections. Basal cisterns are patent. VASCULAR: Severe calcific atherosclerosis of the carotid siphons. Moderate calcific atherosclerosis of bilateral vertebral arteries. SKULL: No skull fracture. No significant scalp soft tissue swelling. SINUSES/ORBITS: The mastoid air-cells and included paranasal sinuses are well-aerated. Status post bilateral ocular lens implants.  The included ocular globes and orbital contents are non-suspicious. OTHER: None. CTA NECK AORTIC ARCH: Normal appearance of the thoracic arch, normal branch pattern. Moderate calcific atherosclerosis. Mild stenosis LEFT subclavian artery origin. Common origin of the innominate and LEFT Common carotid artery which is widely patent. RIGHT CAROTID SYSTEM: Common carotid artery is patent, coursing in a straight line fashion. Severe calcific atherosclerosis of the distal Common carotid artery. Normal appearance of the carotid bifurcation without hemodynamically significant stenosis by NASCET criteria. Normal appearance of the included internal carotid artery. LEFT CAROTID SYSTEM: Common carotid artery is widely patent, coursing in a straight line fashion. Normal appearance of the carotid bifurcation without hemodynamically significant stenosis by NASCET criteria. Moderate eccentric calcific atherosclerosis. Normal appearance of the included internal carotid artery. VERTEBRAL ARTERIES:Codominant vertebral artery's. Normal appearance of the vertebral arteries, which appear widely patent. SKELETON: No acute osseous process though bone windows have not been submitted. Moderate C5-6 and C6-7 degenerative discs. Severe bilateral C5-6 and C6-7 neural foraminal narrowing. Moderate canal stenosis C5-6. OTHER NECK: Soft tissues of the neck are non-acute though, not tailored for evaluation. Punctate RIGHT parotid sialolith. CTA HEAD ANTERIOR CIRCULATION: Patent bilateral cervical, petrous, cavernous internal carotid arteries with mild stenosis the carotid siphon data heavy vascular calcifications. Bilateral anterior and middle cerebral arteries are widely patent. Mild luminal regularity. No large vessel occlusion, dissection, contrast extravasation or aneurysm. POSTERIOR CIRCULATION: Normal appearance of the vertebral arteries, vertebrobasilar junction and basilar artery. Occluded RIGHT posterior-inferior cerebellar artery  approximate 1 cm from the origin. Patent cerebral arteries with moderate tandem stenoses. No dissection, contrast extravasation or aneurysm. VENOUS SINUSES: Major dural venous sinuses are patent though not tailored for evaluation on this angiographic examination. ANATOMIC VARIANTS: None. DELAYED PHASE: No abnormal enhancement. IMPRESSION: CT HEAD: Evolving RIGHT posterior-inferior cerebellar artery territory acute infarct without hemorrhagic conversion. Otherwise negative CT HEAD for age. CTA NECK: Atherosclerosis without hemodynamically significant stenosis or acute vascular process. CTA HEAD: Acute RIGHT posterior-inferior cerebellar artery occlusion within 1 cm of the origin. Moderate tandem stenosis bilateral posterior cerebral arteries compatible with atherosclerosis. Mild anterior circulation atherosclerosis. Severe calcific atherosclerosis of the carotid siphons. Electronically Signed   By: Awilda Metro M.D.   On: 01/30/2016 02:34   Mr Brain Wo Contrast  Result Date: 01/29/2016 CLINICAL DATA:  Dizziness with gait disturbance. Nausea, vomiting, and weakness for 2 days. EXAM: MRI HEAD WITHOUT CONTRAST TECHNIQUE: Multiplanar, multiecho pulse sequences of the brain and surrounding structures were obtained without intravenous contrast. COMPARISON:  05/23/2010 FINDINGS: Brain: There is a large acute right cerebellar PICA territory infarct. There is associated cytotoxic edema without significant posterior fossa mass effect or evidence of associated hemorrhage. There is a punctate acute infarct versus artifact in the right lower pons, not confirmed on coronal imaging. Partially empty sella is unchanged. Chronic microhemorrhages are noted in posterior right frontal cortex and left centrum  semiovale. No mass, midline shift, or extra-axial fluid collection is seen. There is age-appropriate cerebral atrophy. Deep cerebral white matter T2 hyperintensities have mildly progressed from 2012 and are nonspecific but  compatible with mild chronic small vessel ischemic disease. Vascular: Major intracranial vascular flow voids are preserved. Skull and upper cervical spine: No suspicious osseous lesion. Sinuses/Orbits: Prior bilateral cataract extraction.  Clear sinuses. Other: None. IMPRESSION: 1. Acute large right cerebellar PICA territory infarct. 2. Punctate acute infarct versus artifact in the pons. 3. Mild chronic small vessel ischemic disease. Electronically Signed   By: Sebastian Ache M.D.   On: 01/29/2016 15:45    Lab Data:  CBC:  Recent Labs Lab 01/29/16 0954  WBC 12.2*  HGB 14.3  HCT 42.7  MCV 91.6  PLT 211   Basic Metabolic Panel:  Recent Labs Lab 01/29/16 0954  NA 139  K 4.4  CL 101  CO2 29  GLUCOSE 131*  BUN 20  CREATININE 0.90  CALCIUM 9.3   GFR: Estimated Creatinine Clearance: 46.4 mL/min (by C-G formula based on SCr of 0.9 mg/dL). Liver Function Tests:  Recent Labs Lab 01/29/16 0954  AST 21  ALT 17  ALKPHOS 54  BILITOT 1.2  PROT 6.8  ALBUMIN 4.1    Recent Labs Lab 01/29/16 0954  LIPASE 20   No results for input(s): AMMONIA in the last 168 hours. Coagulation Profile: No results for input(s): INR, PROTIME in the last 168 hours. Cardiac Enzymes: No results for input(s): CKTOTAL, CKMB, CKMBINDEX, TROPONINI in the last 168 hours. BNP (last 3 results) No results for input(s): PROBNP in the last 8760 hours. HbA1C:  Recent Labs  01/30/16 0636  HGBA1C 5.8*   CBG: No results for input(s): GLUCAP in the last 168 hours. Lipid Profile:  Recent Labs  01/30/16 0636  CHOL 126  HDL 34*  LDLCALC 59  TRIG 811*  CHOLHDL 3.7   Thyroid Function Tests: No results for input(s): TSH, T4TOTAL, FREET4, T3FREE, THYROIDAB in the last 72 hours. Anemia Panel: No results for input(s): VITAMINB12, FOLATE, FERRITIN, TIBC, IRON, RETICCTPCT in the last 72 hours. Urine analysis:    Component Value Date/Time   COLORURINE YELLOW 01/29/2016 0954   APPEARANCEUR CLEAR  01/29/2016 0954   LABSPEC 1.025 01/29/2016 0954   PHURINE 5.5 01/29/2016 0954   GLUCOSEU NEGATIVE 01/29/2016 0954   HGBUR NEGATIVE 01/29/2016 0954   BILIRUBINUR NEGATIVE 01/29/2016 0954   KETONESUR NEGATIVE 01/29/2016 0954   PROTEINUR NEGATIVE 01/29/2016 0954   UROBILINOGEN 1.0 05/19/2010 2127   NITRITE NEGATIVE 01/29/2016 0954   LEUKOCYTESUR NEGATIVE 01/29/2016 0954     RAI,RIPUDEEP M.D. [facility] 02/01/2016, 12:21 PM  Pager: [phone] Between 7am to 7pm - call Pager - [phone]  After 7pm go to [email or web address] - password [email or web address]  Call night coverage person covering after 7pm

## 2016-02-01 NOTE — Progress Notes (Signed)
Rehab admissions - I have not heard back from insurance carrier or from patient's family regarding expedited appeal.  This process could take 48 to 72 hours to complete.  Call me for questions.  #[phone]

## 2016-02-01 NOTE — Progress Notes (Signed)
Patient states he feels slightly nauseated, states no BM since Saturday. PRN senna administered, prune juice given. No output.  MD notified, ordered miralax once, miralax given.

## 2016-02-01 NOTE — Care Management Important Message (Signed)
Important Message  Patient Details  Name: Bruce Clarke MRN: [id number] Date of Birth: August 21, 1916   Medicare Important Message Given:  Yes    Kentrell Guettler Stefan ChurchBratton 02/01/2016, 10:19 AM

## 2016-02-01 NOTE — [place] FL2 (Signed)
[place] MEDICAID FL2 LEVEL OF CARE SCREENING TOOL     IDENTIFICATION  Patient Name: Bruce Clarke Birthdate: June 12, 1916 Sex: male Admission Date (Current Location): 01/29/2016  [facility] [place]      Facility and Address:  [facility], [address], [place], [place] [address]      Provider Number: [id number]  Attending Physician Name and Address:  Cathren Harshipudeep K Rai, MD  Relative Name and Phone Number:       Current Level of Care: Hospital Recommended Level of Care: Skilled Nursing Facility Prior Approval Number:    Date Approved/Denied:   PASRR Number: [id number]  Discharge Plan: SNF    Current Diagnoses: Patient Active Problem List   Diagnosis Date Noted  . Acute CVA (cerebrovascular accident) (HCC) 01/30/2016  . Memory deficit after cerebral infarction   . Bradycardia   . Leukocytosis   . Stroke (cerebrum) (HCC) 01/29/2016  . Cerebellar stroke (HCC) 01/29/2016  . Dehydration 01/29/2016  . Benign essential HTN 01/29/2016  . Hyperlipidemia 01/29/2016  . CAD (coronary artery disease) of bypass graft 01/29/2016  . Glaucoma 01/29/2016    Orientation RESPIRATION BLADDER Height & Weight     Self, Time, Situation, Place  Normal Continent Weight: 167 lb 12.3 oz (76.1 kg) Height:  5\' 8"  (172.7 cm)  BEHAVIORAL SYMPTOMS/MOOD NEUROLOGICAL BOWEL NUTRITION STATUS   (none)  (none) Continent Diet (Heart Healthy)  AMBULATORY STATUS COMMUNICATION OF NEEDS Skin   Limited Assist Verbally Normal                       Personal Care Assistance Level of Assistance  Bathing, Feeding, Dressing Bathing Assistance: Limited assistance Feeding assistance: Independent Dressing Assistance: Limited assistance     Functional Limitations Info  Sight, Hearing, Speech Sight Info: Adequate Hearing Info: Adequate Speech Info: Adequate    SPECIAL CARE FACTORS FREQUENCY  PT (By licensed PT), OT (By licensed OT)     PT Frequency: 5/ week OT  Frequency: 5/ week            Contractures Contractures Info: Not present    Additional Factors Info  Code Status, Allergies Code Status Info: Full Allergies Info: Ibuprofen           Current Medications (02/01/2016):  This is the current hospital active medication list Current Facility-Administered Medications  Medication Dose Route Frequency Provider Last Rate Last Dose  . aspirin EC tablet 81 mg  81 mg Oral Daily Layne BentonSharon L Biby, NP   81 mg at 02/01/16 1037  . brimonidine (ALPHAGAN) 0.2 % ophthalmic solution 1 drop  1 drop Both Eyes Q12H Renae FickleMackenzie Short, MD   1 drop at 02/01/16 1038   And  . timolol (TIMOPTIC) 0.5 % ophthalmic solution 1 drop  1 drop Both Eyes Q12H Renae FickleMackenzie Short, MD   1 drop at 02/01/16 1038  . cholecalciferol (VITAMIN D) tablet 1,000 Units  1,000 Units Oral Daily Renae FickleMackenzie Short, MD   1,000 Units at 02/01/16 1037  . clopidogrel (PLAVIX) tablet 75 mg  75 mg Oral Daily Layne BentonSharon L Biby, NP   75 mg at 02/01/16 1037  . docusate sodium (COLACE) capsule 100 mg  100 mg Oral BID Renae FickleMackenzie Short, MD   100 mg at 02/01/16 1037  . enoxaparin (LOVENOX) injection 40 mg  40 mg Subcutaneous Q24H Renae FickleMackenzie Short, MD   40 mg at 01/31/16 2132  . finasteride (PROSCAR) tablet 5 mg  5 mg Oral Daily Renae FickleMackenzie Short, MD  5 mg at 02/01/16 1037  . furosemide (LASIX) tablet 40 mg  40 mg Oral Daily Renae Fickle, MD   40 mg at 02/01/16 1037  . hydrALAZINE (APRESOLINE) injection 10 mg  10 mg Intravenous Q4H PRN Renae Fickle, MD   10 mg at 02/01/16 0651  . latanoprost (XALATAN) 0.005 % ophthalmic solution 1 drop  1 drop Both Eyes QHS Renae Fickle, MD   1 drop at 01/31/16 2133  . lisinopril (PRINIVIL,ZESTRIL) tablet 5 mg  5 mg Oral Daily Renae Fickle, MD   5 mg at 02/01/16 1037  . loratadine (CLARITIN) tablet 10 mg  10 mg Oral Daily PRN Renae Fickle, MD      . nitroGLYCERIN (NITROSTAT) SL tablet 0.4 mg  0.4 mg Sublingual Q5 min PRN Renae Fickle, MD      . ondansetron [facility])  injection 4 mg  4 mg Intravenous Q6H PRN Renae Fickle, MD   4 mg at 01/30/16 1803  . potassium chloride (K-DUR,KLOR-CON) CR tablet 10 mEq  10 mEq Oral Daily Renae Fickle, MD   10 mEq at 02/01/16 1037  . promethazine (PHENERGAN) injection 12.5 mg  12.5 mg Intravenous Q6H PRN Renae Fickle, MD      . senna (SENOKOT) tablet 8.6-17.2 mg  1-2 tablet Oral Daily PRN Tyrone Nine, MD   17.2 mg at 02/01/16 1111  . simvastatin (ZOCOR) tablet 20 mg  20 mg Oral Daily Renae Fickle, MD   20 mg at 02/01/16 1037  . vitamin B-12 (CYANOCOBALAMIN) tablet 100 mcg  100 mcg Oral Daily Renae Fickle, MD   100 mcg at 02/01/16 1037     Discharge Medications: Please see discharge summary for a list of discharge medications.  Relevant Imaging Results:  Relevant Lab Results:   Additional Information SSN: [id number]  [facility], LCSW

## 2016-02-02 LAB — BASIC METABOLIC PANEL
Anion gap: 9 (ref 5–15)
BUN: 10 mg/dL (ref 6–20)
CO2: 27 mmol/L (ref 22–32)
Calcium: 9.2 mg/dL (ref 8.9–10.3)
Chloride: 98 mmol/L — ABNORMAL LOW (ref 101–111)
Creatinine, Ser: 0.82 mg/dL (ref 0.61–1.24)
GFR calc Af Amer: >60 mL/min (ref >60)
GFR calc non Af Amer: >60 mL/min (ref >60)
Glucose, Bld: 129 mg/dL — ABNORMAL HIGH (ref 65–99)
Potassium: 3.6 mmol/L (ref 3.5–5.1)
Sodium: 134 mmol/L — ABNORMAL LOW (ref 135–145)

## 2016-02-02 LAB — CBC
HCT: 40.9 % (ref 39.0–52.0)
Hemoglobin: 13.7 g/dL (ref 13.0–17.0)
MCH: 30.4 pg (ref 26.0–34.0)
MCHC: 33.5 g/dL (ref 30.0–36.0)
MCV: 90.9 fL (ref 78.0–100.0)
Platelets: 218 10*3/uL (ref 150–400)
RBC: 4.5 MIL/uL (ref 4.22–5.81)
RDW: 13.3 % (ref 11.5–15.5)
WBC: 7.6 10*3/uL (ref 4.0–10.5)

## 2016-02-02 MED ORDER — POLYETHYLENE GLYCOL 3350 17 G PO PACK
17.0000 g | PACK | Freq: Every day | ORAL | Status: DC
  Administered 2016-02-02: 17 g via ORAL
  Filled 2016-02-02: qty 1

## 2016-02-02 MED ORDER — SENNA 8.6 MG PO TABS: 1.0000 | ORAL_TABLET | Freq: Every day | ORAL | 0 refills | Status: DC | PRN

## 2016-02-02 MED ORDER — CLOPIDOGREL BISULFATE 75 MG PO TABS: 75.0000 mg | ORAL_TABLET | Freq: Every day | ORAL | Status: AC

## 2016-02-02 MED ORDER — ASPIRIN EC 81 MG PO TBEC: 81.0000 mg | DELAYED_RELEASE_TABLET | Freq: Every day | ORAL | Status: AC

## 2016-02-02 NOTE — Progress Notes (Signed)
Patient is A/O, no noted distress. Denies pain . Educated patient on medications, tolerated meds well. Patient hard of hearing. Patient ambulated to BR with RW and standby assist successfully. Chair alarm in place and active, call light at hand. Staff will continue to monitor, meet needs, and maintain safety.

## 2016-02-02 NOTE — Progress Notes (Signed)
Report given to Selena BattenKim, the facility is ready to accept patient.

## 2016-02-02 NOTE — Progress Notes (Signed)
Removed IV line. Daughter has hearing aids.

## 2016-02-02 NOTE — Care Management Note (Signed)
Case Management Note  Patient Details  Name: Bruce Clarke MRN: [id number] Date of Birth: 04-04-1917  Subjective/Objective:                    Action/Plan: Pt denied CIR per his insurance. Pt discharging [facility]. CSW making arrangements with [facility] and transportation. Pts daughter to go and sign paperwork at [facility]. Paper scrubs [facility] in [facility]. Pts daughter took his belongings with her. No further needs per CM.   Expected Discharge Date:                  Expected Discharge Plan:  IP Rehab Facility  In-House Referral:  Clinical Social Work  Discharge planning Services  CM Consult  Post Acute Care Choice:    Choice offered to:     DME Arranged:    DME Agency:     HH Arranged:    HH Agency:     Status of Service:  Completed, signed off  If discussed at [employer] Meetings, dates discussed:    Additional Comments:  Kermit BaloKelli F Joselin Crandell, RN 02/02/2016, 2:26 PM

## 2016-02-02 NOTE — Progress Notes (Signed)
Physical Therapy Treatment Patient Details Name: Cru Kritikos MRN: [id number] DOB: [birth year] Today's Date: 02/02/2016    History of Present Illness Cesareo Vickrey is an 82 y.o. male history of hypertension, hyperlipidemia, coronary artery disease and glaucoma who presented with new onset stable gait and lightheadedness.  MRI positive for with acute right PICA distribution ischemic infarction as well as possible acute pontine infarction     PT Comments    Patient progressing towards goals initiating work on stairs this session.  Berg score 5/56 demonstrates high fall risk.  Still demonstrates ataxia when attempting to balance without UE support.  Will need skilled inpatient rehab prior to d/c home.   Follow Up Recommendations  SNF     Equipment Recommendations  None recommended by PT    Recommendations for Other Services       Precautions / Restrictions Precautions Precautions: Fall    Mobility  Bed Mobility Overal bed mobility: Needs Assistance         Sit to supine: Supervision   General bed mobility comments: increased time, used rails to scoot to [facility] with many attempts  Transfers Overall transfer level: Needs assistance Equipment used: Rolling walker (2 wheeled) Transfers: Sit to/from Stand Sit to Stand: Min assist         General transfer comment: assist for forward weight shift, hand placement, safety  Ambulation/Gait Ambulation/Gait assistance: Mod assist;Min assist Ambulation Distance (Feet): 150 Feet Assistive device: Rolling walker (2 wheeled) Gait Pattern/deviations: Step-through pattern;Wide base of support;Trunk flexed;Decreased stride length     General Gait Details: leaning to R esp on L turns with increased assist needed for balance, cues for forward gaze for improved orientation to midline, assist for walker management on turns   Stairs Stairs: Yes Stairs assistance: Min assist Stair Management: Two rails;Forwards;Alternating  pattern Number of Stairs: 2 General stair comments: more assist at top of steps with turning to come down due to ataxia  Wheelchair Mobility    Modified Rankin (Stroke Patients Only) Modified Rankin (Stroke Patients Only) Pre-Morbid Rankin Score: No symptoms Modified Rankin: Moderately severe disability     Balance                                    Cognition Arousal/Alertness: Awake/alert Behavior During Therapy: WFL for tasks assessed/performed Overall Cognitive Status: Within Functional Limits for tasks assessed                      Exercises      General Comments General comments (skin integrity, edema, etc.): Patient high fall risk per Sharlene Motts with inabilty to stabilize without UE support or external support      Pertinent Vitals/Pain Pain Assessment: No/denies pain    Home Living                      Prior Function            PT Goals (current goals can now be found in the care plan section) Progress towards PT goals: Progressing toward goals    Frequency    Min 4X/week      PT Plan Discharge plan needs to be updated    Co-evaluation             End of Session Equipment Utilized During Treatment: Gait belt Activity Tolerance: Patient tolerated treatment well Patient left: in bed;with call bell/phone within reach;with family/visitor present  Time: [id number] PT Time Calculation (min) (ACUTE ONLY): 24 min  Charges:  $Gait Training: 8-22 mins $Neuromuscular Re-education: 8-22 mins                    G Codes:      Elray McgregorCynthia Wynn 02/02/2016, 2:45 PM  Sheran Lawlessyndi Wynn, [place] [phone] 02/02/2016

## 2016-02-02 NOTE — Progress Notes (Signed)
Rehab admissions - I had a discussion with the daughter by phone today and then daughter met with unit case manager.  Daughter has decided not to pursue expedited appeal and patient will proceed to SNF today.  Call me for questions.  #[phone]

## 2016-02-02 NOTE — Clinical Social Work Note (Signed)
Pt is ready for discharge today and will go to [facility]. Pt and daughter are aware and agreeable to discharge plan. Facility is ready to admit pt as they have received discharge information. RN called report. [facility] will provide transportation. CSW is signing off as no further needs identified.   Dede QuerySarah Raeleigh Guinn, MSW, LCSW  Clinical Social Worker [phone]

## 2016-02-02 NOTE — Clinical Social Work Placement (Signed)
   CLINICAL SOCIAL WORK PLACEMENT  NOTE  Date:  02/02/2016  Patient Details  Name: Bruce Clarke MRN: [id number] Date of Birth: 10-22-1916  Clinical Social Work is seeking post-discharge placement for this patient at the Skilled  Nursing Facility level of care (*CSW will initial, date and re-position this form in  chart as items are completed):  Yes   Patient/family provided with [place] Clinical Social Work Department's list of facilities offering this level of care within the geographic area requested by the patient (or if unable, by the patient's family).  Yes   Patient/family informed of their freedom to choose among providers that offer the needed level of care, that participate in Medicare, Medicaid or managed care program needed by the patient, have an available bed and are willing to accept the patient.  Yes   Patient/family informed of [facility]'s ownership interest in [facility] and [facility], as well as of the fact that they are under no obligation to receive care at these facilities.  PASRR submitted to EDS on       PASRR number received on       Existing PASRR number confirmed on 02/01/16     FL2 transmitted to all facilities in geographic area requested by pt/family on 02/01/16     FL2 transmitted to all facilities within larger geographic area on       Patient informed that his/her managed care company has contracts with or will negotiate with certain facilities, including the following:        Yes   Patient/family informed of bed offers received.  [facility]     Physician recommends and patient chooses bed at      [facility] 02[facility].  Patient to be transferred to facility by [facility]     Patient family notified on 02/02/16 of transfer.  Name of family member notified:  Pt's daughter, [name]     PHYSICIAN Please prepare priority discharge summary, including medications, Please sign FL2,  Please prepare prescriptions     Additional Comment:    _______________________________________________ Dede QuerySarah Dorrie Cocuzza, LCSW 02/02/2016, 4:10 PM

## 2016-02-02 NOTE — Discharge Summary (Addendum)
Physician Discharge Summary   Patient ID: Bruce Clarke MRN: [id number] DOB/AGE: 1916/06/21 82 y.o.  Admit date: 01/29/2016 Discharge date: 02/02/2016  Primary Care Physician:  No PCP Per Patient  Discharge Diagnoses:    . AcuteCerebellar CVA (cerebrovascular accident) (HCC) Dehydration Hypertension Hyperlipidemia Coronary artery disease with history of CABG Stable angina Chronic diastolic CHF Glaucoma   Consults:  Neurology, Dr Roda Shutters Inpatient rehabilitation  Recommendations for Outpatient Follow-up:  1. Please repeat CBC/BMET at next visit 2. Fall precautions 3. Continue aspirin and Plavix for 3 months, then continue Plavix only 4. CODE STATUS: per patient's request  Partial code : If heart stops beating, no CPR, no defibrillation or cardioversion but would want ICU, ventilator, vasopressors and ACLS medications for arrhythmias   DIET:  Heart healthy diet    Allergies:   Allergies  Allergen Reactions  . Ibuprofen Swelling     DISCHARGE MEDICATIONS: Current Discharge Medication List    START taking these medications   Details  clopidogrel (PLAVIX) 75 MG tablet Take 1 tablet (75 mg total) by mouth daily.    senna (SENOKOT) 8.6 MG TABS tablet Take 1-2 tablets (8.6-17.2 mg total) by mouth daily as needed for mild constipation. Qty: 120 each, Refills: 0      CONTINUE these medications which have CHANGED   Details  aspirin EC 81 MG tablet Take 1 tablet (81 mg total) by mouth daily. Continue aspirin and Plavix for 3 months and then Plavix only      CONTINUE these medications which have NOT CHANGED   Details  bimatoprost (LUMIGAN) 0.01 % SOLN Place 1 drop into both eyes at bedtime.    brimonidine-timolol (COMBIGAN) 0.2-0.5 % ophthalmic solution Place 1 drop into both eyes every 12 (twelve) hours.    Cholecalciferol (VITAMIN D-1000 MAX ST) 1000 units tablet Take 1 tablet by mouth daily.    docusate sodium (STOOL SOFTENER) 100 MG capsule Take 100 mg by mouth 2  (two) times daily.    finasteride (PROSCAR) 5 MG tablet Take 5 mg by mouth daily.    furosemide (LASIX) 40 MG tablet Take 1 tablet by mouth daily.    ketoconazole (NIZORAL) 2 % cream Apply 1 application topically as directed.    lisinopril (PRINIVIL,ZESTRIL) 5 MG tablet Take 1 tablet by mouth daily.    loratadine (CLARITIN) 10 MG tablet Take 10 mg by mouth daily as needed for allergies.    nitroGLYCERIN (NITROSTAT) 0.4 MG SL tablet Take 1 tablet by mouth as directed.    potassium chloride (MICRO-K) 10 MEQ CR capsule Take 1 capsule by mouth daily.    simvastatin (ZOCOR) 20 MG tablet Take 1 tablet by mouth daily.    vitamin B-12 (CYANOCOBALAMIN) 100 MCG tablet Take 100 mcg by mouth daily.         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 On arrival he exhibited mild HTN and ataxia.MRI brain demonstrated an acute large right cerebellar PICA territory infarct with punctate acute infarct versus artifact in the pons.Neurology recommended admission to [facility] for ongoing work up and management by stroke team. Work up has not revealed etiology of likely embolic strokes. Therapy has worked with him and recommends CIR.   Hospital Course:  Acute cerebellarstroke: Also with possible infarct in pons suggestive  of superior cerebellar artery (in addition to PICA) territory stroke. - MRI of the brain showed acute large right cerebellar PICA infarct, punctate acute infarct versus artifact in the pons. Mild chronic small vessel ischemic disease - Telemetry showed no AFib: Dr Jarvis Newcomer called cardiology who will set up 30-day event monitor at discharge. I sent email to [facility] heart care coordinator to call patient's daughter to set up.  -  CTA head and neck: No significant stenosis - Echocardiogram showed EF of 50%, basal to mid inferior hypokinesis with grade 1 diastolic dysfunction.  - Per neurology, Aspirin 81mg  + plavix 75mg  daily x3 months, then plavix alone - LDL at goal, 59, on Zocor 20 mg daily -HbA1c 5.8%.   Dehydration: Resolved.  Hypertension: Chronic, stable. - Continue lisinopril  Hyperlipidemia - Continue simvastatin  CAD with hx of CABG, LBBB, stable angina - Continue aspirin, statin, and ACEI. Not on beta blocker due to bradycardia - Continue NTG  Chronic diastolic heart failure,appears euvolemic - Continue lasix 40mg  daily  Glaucoma: Chronic, stable - Continue bimatoprost annd combigan  Day of Discharge BP (!) 159/59 (BP Location: Right Arm)   Pulse 62   Temp 98.3 F (36.8 C) (Oral)   Resp 20   Ht 5\' 8"  (1.727 m)   Wt 76.1 kg (167 lb 12.3 oz)   SpO2 96%   BMI 25.51 kg/m   Physical Exam: General: Alert and awake oriented x3 not in any acute distress. HEENT: anicteric sclera, pupils reactive to light and accommodation CVS: S1-S2 clear no murmur rubs or gallops Chest: clear to auscultation bilaterally, no wheezing rales or rhonchi Abdomen: soft nontender, nondistended, normal bowel sounds Extremities: no cyanosis, clubbing or edema noted bilaterally Neuro: Cranial nerves II-XII intact, no focal neurological deficits   The results of significant diagnostics from this hospitalization (including imaging, microbiology, ancillary and laboratory) are listed below for reference.    LAB RESULTS: Basic Metabolic Panel:  Recent Labs Lab 01/29/16 0954 02/02/16 0149  NA 139 134*  K 4.4 3.6  CL 101 98*  CO2 29 27  GLUCOSE 131* 129*  BUN 20 10  CREATININE 0.90 0.82  CALCIUM 9.3 9.2   Liver Function Tests:  Recent Labs Lab 01/29/16 0954  AST 21  ALT 17  ALKPHOS 54  BILITOT 1.2  PROT 6.8  ALBUMIN 4.1    Recent Labs Lab 01/29/16 0954  LIPASE 20   No  results for input(s): AMMONIA in the last 168 hours. CBC:  Recent Labs Lab 01/29/16 0954 02/02/16 0149  WBC 12.2* 7.6  HGB 14.3 13.7  HCT 42.7 40.9  MCV 91.6 90.9  PLT 211 218   Cardiac Enzymes: No results for input(s): CKTOTAL, CKMB, CKMBINDEX, TROPONINI in the last 168 hours. BNP: Invalid input(s): POCBNP CBG: No results for input(s): GLUCAP in the last 168 hours.  Significant Diagnostic Studies:  Ct Angio Head W Or Wo Contrast  Result Date: 01/30/2016 CLINICAL DATA:  Follow-up posterior circulation stroke. History of hypertension, hyperlipidemia and CABG. EXAM: CT ANGIOGRAPHY HEAD AND NECK TECHNIQUE: Multidetector CT imaging of the head and neck was performed using the standard protocol during bolus administration of intravenous contrast. Multiplanar CT image reconstructions and MIPs were obtained to evaluate the vascular anatomy. Carotid stenosis measurements (when applicable) are obtained utilizing NASCET criteria, using the distal internal carotid diameter as the denominator. CONTRAST:  50 cc Isovue 370 COMPARISON:  MRI of the head January 29, 2016 FINDINGS: CT HEAD BRAIN: Wedge-like hypodensity RIGHT inferior cerebellum with folia effacement. Mild  mass effect on the obex, fourth ventricle remains open. The ventricles and sulci are normal for age. No intraparenchymal hemorrhage, mass effect nor midline shift. Patchy supratentorial white matter hypodensities less than expected for patient's age, though non-specific are most compatible with chronic small vessel ischemic disease. No abnormal extra-axial fluid collections. Basal cisterns are patent. VASCULAR: Severe calcific atherosclerosis of the carotid siphons. Moderate calcific atherosclerosis of bilateral vertebral arteries. SKULL: No skull fracture. No significant scalp soft tissue swelling. SINUSES/ORBITS: The mastoid air-cells and included paranasal sinuses are well-aerated. Status post bilateral ocular lens implants. The included  ocular globes and orbital contents are non-suspicious. OTHER: None. CTA NECK AORTIC ARCH: Normal appearance of the thoracic arch, normal branch pattern. Moderate calcific atherosclerosis. Mild stenosis LEFT subclavian artery origin. Common origin of the innominate and LEFT Common carotid artery which is widely patent. RIGHT CAROTID SYSTEM: Common carotid artery is patent, coursing in a straight line fashion. Severe calcific atherosclerosis of the distal Common carotid artery. Normal appearance of the carotid bifurcation without hemodynamically significant stenosis by NASCET criteria. Normal appearance of the included internal carotid artery. LEFT CAROTID SYSTEM: Common carotid artery is widely patent, coursing in a straight line fashion. Normal appearance of the carotid bifurcation without hemodynamically significant stenosis by NASCET criteria. Moderate eccentric calcific atherosclerosis. Normal appearance of the included internal carotid artery. VERTEBRAL ARTERIES:Codominant vertebral artery's. Normal appearance of the vertebral arteries, which appear widely patent. SKELETON: No acute osseous process though bone windows have not been submitted. Moderate C5-6 and C6-7 degenerative discs. Severe bilateral C5-6 and C6-7 neural foraminal narrowing. Moderate canal stenosis C5-6. OTHER NECK: Soft tissues of the neck are non-acute though, not tailored for evaluation. Punctate RIGHT parotid sialolith. CTA HEAD ANTERIOR CIRCULATION: Patent bilateral cervical, petrous, cavernous internal carotid arteries with mild stenosis the carotid siphon data heavy vascular calcifications. Bilateral anterior and middle cerebral arteries are widely patent. Mild luminal regularity. No large vessel occlusion, dissection, contrast extravasation or aneurysm. POSTERIOR CIRCULATION: Normal appearance of the vertebral arteries, vertebrobasilar junction and basilar artery. Occluded RIGHT posterior-inferior cerebellar artery approximate 1 cm from  the origin. Patent cerebral arteries with moderate tandem stenoses. No dissection, contrast extravasation or aneurysm. VENOUS SINUSES: Major dural venous sinuses are patent though not tailored for evaluation on this angiographic examination. ANATOMIC VARIANTS: None. DELAYED PHASE: No abnormal enhancement. IMPRESSION: CT HEAD: Evolving RIGHT posterior-inferior cerebellar artery territory acute infarct without hemorrhagic conversion. Otherwise negative CT HEAD for age. CTA NECK: Atherosclerosis without hemodynamically significant stenosis or acute vascular process. CTA HEAD: Acute RIGHT posterior-inferior cerebellar artery occlusion within 1 cm of the origin. Moderate tandem stenosis bilateral posterior cerebral arteries compatible with atherosclerosis. Mild anterior circulation atherosclerosis. Severe calcific atherosclerosis of the carotid siphons. Electronically Signed   By: Awilda Metro M.D.   On: 01/30/2016 02:34   Dg Chest 2 View  Result Date: 01/29/2016 CLINICAL DATA:  Weakness EXAM: CHEST  2 VIEW COMPARISON:  05/19/2010 chest radiograph. FINDINGS: Sternotomy wires appear aligned and intact. Stable cardiomediastinal silhouette with top-normal heart size and aortic atherosclerosis. No pneumothorax. No pleural effusion. No pulmonary edema. No acute consolidative airspace disease. IMPRESSION: No active cardiopulmonary disease. Aortic atherosclerosis. Electronically Signed   By: Delbert Phenix M.D.   On: 01/29/2016 12:51   Ct Angio Neck W Or Wo Contrast  Result Date: 01/30/2016 CLINICAL DATA:  Follow-up posterior circulation stroke. History of hypertension, hyperlipidemia and CABG. EXAM: CT ANGIOGRAPHY HEAD AND NECK TECHNIQUE: Multidetector CT imaging of the head and neck was performed using the standard protocol during  bolus administration of intravenous contrast. Multiplanar CT image reconstructions and MIPs were obtained to evaluate the vascular anatomy. Carotid stenosis measurements (when applicable)  are obtained utilizing NASCET criteria, using the distal internal carotid diameter as the denominator. CONTRAST:  50 cc Isovue 370 COMPARISON:  MRI of the head January 29, 2016 FINDINGS: CT HEAD BRAIN: Wedge-like hypodensity RIGHT inferior cerebellum with folia effacement. Mild mass effect on the obex, fourth ventricle remains open. The ventricles and sulci are normal for age. No intraparenchymal hemorrhage, mass effect nor midline shift. Patchy supratentorial white matter hypodensities less than expected for patient's age, though non-specific are most compatible with chronic small vessel ischemic disease. No abnormal extra-axial fluid collections. Basal cisterns are patent. VASCULAR: Severe calcific atherosclerosis of the carotid siphons. Moderate calcific atherosclerosis of bilateral vertebral arteries. SKULL: No skull fracture. No significant scalp soft tissue swelling. SINUSES/ORBITS: The mastoid air-cells and included paranasal sinuses are well-aerated. Status post bilateral ocular lens implants. The included ocular globes and orbital contents are non-suspicious. OTHER: None. CTA NECK AORTIC ARCH: Normal appearance of the thoracic arch, normal branch pattern. Moderate calcific atherosclerosis. Mild stenosis LEFT subclavian artery origin. Common origin of the innominate and LEFT Common carotid artery which is widely patent. RIGHT CAROTID SYSTEM: Common carotid artery is patent, coursing in a straight line fashion. Severe calcific atherosclerosis of the distal Common carotid artery. Normal appearance of the carotid bifurcation without hemodynamically significant stenosis by NASCET criteria. Normal appearance of the included internal carotid artery. LEFT CAROTID SYSTEM: Common carotid artery is widely patent, coursing in a straight line fashion. Normal appearance of the carotid bifurcation without hemodynamically significant stenosis by NASCET criteria. Moderate eccentric calcific atherosclerosis. Normal  appearance of the included internal carotid artery. VERTEBRAL ARTERIES:Codominant vertebral artery's. Normal appearance of the vertebral arteries, which appear widely patent. SKELETON: No acute osseous process though bone windows have not been submitted. Moderate C5-6 and C6-7 degenerative discs. Severe bilateral C5-6 and C6-7 neural foraminal narrowing. Moderate canal stenosis C5-6. OTHER NECK: Soft tissues of the neck are non-acute though, not tailored for evaluation. Punctate RIGHT parotid sialolith. CTA HEAD ANTERIOR CIRCULATION: Patent bilateral cervical, petrous, cavernous internal carotid arteries with mild stenosis the carotid siphon data heavy vascular calcifications. Bilateral anterior and middle cerebral arteries are widely patent. Mild luminal regularity. No large vessel occlusion, dissection, contrast extravasation or aneurysm. POSTERIOR CIRCULATION: Normal appearance of the vertebral arteries, vertebrobasilar junction and basilar artery. Occluded RIGHT posterior-inferior cerebellar artery approximate 1 cm from the origin. Patent cerebral arteries with moderate tandem stenoses. No dissection, contrast extravasation or aneurysm. VENOUS SINUSES: Major dural venous sinuses are patent though not tailored for evaluation on this angiographic examination. ANATOMIC VARIANTS: None. DELAYED PHASE: No abnormal enhancement. IMPRESSION: CT HEAD: Evolving RIGHT posterior-inferior cerebellar artery territory acute infarct without hemorrhagic conversion. Otherwise negative CT HEAD for age. CTA NECK: Atherosclerosis without hemodynamically significant stenosis or acute vascular process. CTA HEAD: Acute RIGHT posterior-inferior cerebellar artery occlusion within 1 cm of the origin. Moderate tandem stenosis bilateral posterior cerebral arteries compatible with atherosclerosis. Mild anterior circulation atherosclerosis. Severe calcific atherosclerosis of the carotid siphons. Electronically Signed   By: Awilda Metroourtnay  Bloomer  M.D.   On: 01/30/2016 02:34   Mr Brain Wo Contrast  Result Date: 01/29/2016 CLINICAL DATA:  Dizziness with gait disturbance. Nausea, vomiting, and weakness for 2 days. EXAM: MRI HEAD WITHOUT CONTRAST TECHNIQUE: Multiplanar, multiecho pulse sequences of the brain and surrounding structures were obtained without intravenous contrast. COMPARISON:  05/23/2010 FINDINGS: Brain: There is a large acute right cerebellar  PICA territory infarct. There is associated cytotoxic edema without significant posterior fossa mass effect or evidence of associated hemorrhage. There is a punctate acute infarct versus artifact in the right lower pons, not confirmed on coronal imaging. Partially empty sella is unchanged. Chronic microhemorrhages are noted in posterior right frontal cortex and left centrum semiovale. No mass, midline shift, or extra-axial fluid collection is seen. There is age-appropriate cerebral atrophy. Deep cerebral white matter T2 hyperintensities have mildly progressed from 2012 and are nonspecific but compatible with mild chronic small vessel ischemic disease. Vascular: Major intracranial vascular flow voids are preserved. Skull and upper cervical spine: No suspicious osseous lesion. Sinuses/Orbits: Prior bilateral cataract extraction.  Clear sinuses. Other: None. IMPRESSION: 1. Acute large right cerebellar PICA territory infarct. 2. Punctate acute infarct versus artifact in the pons. 3. Mild chronic small vessel ischemic disease. Electronically Signed   By: Sebastian Ache M.D.   On: 01/29/2016 15:45    2D ECHO: Study Conclusions  - Left ventricle: The cavity size was normal. Wall thickness was   normal. The estimated ejection fraction was 50%. Basal to mid   inferior hypokinesis. Doppler parameters are consistent with   abnormal left ventricular relaxation (grade 1 diastolic   dysfunction). - Aortic valve: Trileaflet; moderately calcified leaflets.   Sclerosis without stenosis. - Mitral valve: Mildly  calcified annulus. Mildly calcified leaflets   . There was no significant regurgitation. - Right ventricle: The cavity size was normal. Systolic function   was normal. - Pulmonary arteries: No complete TR doppler jet so unable to   estimate PA systolic pressure. - Systemic veins: IVC was poorly visualized.  Impressions:  - Normal LV size with EF 50%. Basal to mid inferior hypokinesis.   Normal RV size and systolic function. Aortic valve sclerosis   without significant stenosis.  Disposition and Follow-up: Discharge Instructions    Ambulatory referral to Neurology    Complete by:  As directed    Pt will follow up with Dr. Roda Shutters at [facility] in about 2 months. Thanks.       DISPOSITION: SNF   DISCHARGE FOLLOW-UP  Contact information for follow-up providers    Xu,Jindong, MD. Schedule an appointment as soon as possible for a visit in 6 week(s).   Specialty:  Neurology Contact information: [address] [place] [address] [phone]            Contact information for after-discharge care    Destination    [facility] .   Specialty:  Skilled Nursing Facility Contact information: [address] [place] [address] [phone]                   Time spent on Discharge:   Signed:   Charrisse Masley M.D. [facility] 02/02/2016, 2:23 PM Pager: [phone]

## 2016-02-06 ENCOUNTER — Encounter: Payer: Self-pay | Admitting: Adult Health

## 2016-02-06 ENCOUNTER — Non-Acute Institutional Stay (SKILLED_NURSING_FACILITY): Payer: Medicare Other | Admitting: Adult Health

## 2016-02-06 DIAGNOSIS — N400 Enlarged prostate without lower urinary tract symptoms: Secondary | ICD-10-CM | POA: Diagnosis not present

## 2016-02-06 DIAGNOSIS — I10 Essential (primary) hypertension: Secondary | ICD-10-CM

## 2016-02-06 DIAGNOSIS — E785 Hyperlipidemia, unspecified: Secondary | ICD-10-CM | POA: Diagnosis not present

## 2016-02-06 DIAGNOSIS — H409 Unspecified glaucoma: Secondary | ICD-10-CM | POA: Diagnosis not present

## 2016-02-06 DIAGNOSIS — I878 Other specified disorders of veins: Secondary | ICD-10-CM | POA: Insufficient documentation

## 2016-02-06 DIAGNOSIS — R7301 Impaired fasting glucose: Secondary | ICD-10-CM | POA: Insufficient documentation

## 2016-02-06 DIAGNOSIS — K5901 Slow transit constipation: Secondary | ICD-10-CM

## 2016-02-06 DIAGNOSIS — I5032 Chronic diastolic (congestive) heart failure: Secondary | ICD-10-CM | POA: Diagnosis not present

## 2016-02-06 DIAGNOSIS — E876 Hypokalemia: Secondary | ICD-10-CM | POA: Diagnosis not present

## 2016-02-06 DIAGNOSIS — I25709 Atherosclerosis of coronary artery bypass graft(s), unspecified, with unspecified angina pectoris: Secondary | ICD-10-CM | POA: Diagnosis not present

## 2016-02-06 DIAGNOSIS — R2681 Unsteadiness on feet: Secondary | ICD-10-CM | POA: Diagnosis not present

## 2016-02-06 DIAGNOSIS — I639 Cerebral infarction, unspecified: Secondary | ICD-10-CM | POA: Diagnosis not present

## 2016-02-06 DIAGNOSIS — J309 Allergic rhinitis, unspecified: Secondary | ICD-10-CM | POA: Diagnosis not present

## 2016-02-06 DIAGNOSIS — D518 Other vitamin B12 deficiency anemias: Secondary | ICD-10-CM | POA: Diagnosis not present

## 2016-02-06 NOTE — Progress Notes (Signed)
Patient ID: Bruce Clarke, male   DOB: [birth year], 82 y.o.   MRN: [id number]    DATE:  02/06/2016   MRN:  [id number]  BIRTHDAY: 01-05-1917  Facility:  Nursing Home Location:  [facility]  Nursing Home Room Number: 706-P  LEVEL OF CARE:  SNF (31)  Contact Information    Name Relation Home Work Mobile   [name],[name] Daughter [phone]  [phone]   No name specified           Code Status History    Date Active Date Inactive Code Status Order ID Comments User Context   01/29/2016  8:50 PM 02/02/2016  7:39 PM Partial Code [id number]  Renae Fickle, MD Inpatient    Questions for Most Recent Historical Code Status (Order [id number])    Question Answer Comment   In the event of cardiac or respiratory ARREST: Initiate Code Blue, Call Rapid Response Yes    In the event of cardiac or respiratory ARREST: Perform CPR No    In the event of cardiac or respiratory ARREST: Perform Intubation/Mechanical Ventilation Yes    In the event of cardiac or respiratory ARREST: Use NIPPV/BiPAp only if indicated Yes    In the event of cardiac or respiratory ARREST: Administer ACLS medications if indicated Yes    In the event of cardiac or respiratory ARREST: Perform Defibrillation or Cardioversion if indicated No         Advance Directive Documentation   Flowsheet Row Most Recent Value  Type of Advance Directive  Out of facility DNR (pink MOST or yellow form)  Pre-existing out of facility DNR order (yellow form or pink MOST form)  No data  "MOST" Form in Place?  No data       Chief Complaint  Patient presents with  . Hospitalization Follow-up    HISTORY OF PRESENT ILLNESS:  This is a 82 year old male who has been admitted to [facility] on 02/02/16 from [facility] with acute cerebellar stroke. MRI of the brain showed acute large right cerebellar PICA infarct, likely embolic stroke. Neurology was consulted and recommended ASA 81 mg q d + Plavix 75 mg Q D X 3  months then Plavix alone.  He is seen in his room today and complained of constipation.   He has been admitted for a short-term rehabilitation.  PAST MEDICAL HISTORY:  Past Medical History:  Diagnosis Date  . Acute CVA (cerebrovascular accident) (HCC) 01/2016  . Chronic diastolic CHF (congestive heart failure) (HCC)   . Coronary artery disease   . Dehydration   . Glaucoma   . Hyperlipidemia   . Hypertension   . Stable angina (HCC)      CURRENT MEDICATIONS: Reviewed  Patient's Medications  New Prescriptions   No medications on file  Previous Medications   ASPIRIN EC 81 MG TABLET    Take 1 tablet (81 mg total) by mouth daily. Continue aspirin and Plavix for 3 months and then Plavix only   BIMATOPROST (LUMIGAN) 0.01 % SOLN    Place 1 drop into both eyes at bedtime.   BRIMONIDINE-TIMOLOL (COMBIGAN) 0.2-0.5 % OPHTHALMIC SOLUTION    Place 1 drop into both eyes every 12 (twelve) hours.   CHOLECALCIFEROL (VITAMIN D-1000 MAX ST) 1000 UNITS TABLET    Take 1 tablet by mouth daily.   CLOPIDOGREL (PLAVIX) 75 MG TABLET    Take 1 tablet (75 mg total) by mouth daily.   DOCUSATE SODIUM (STOOL SOFTENER) 100 MG CAPSULE    Take 100  mg by mouth 2 (two) times daily.   FINASTERIDE (PROSCAR) 5 MG TABLET    Take 5 mg by mouth daily.   FUROSEMIDE (LASIX) 40 MG TABLET    Take 1 tablet by mouth daily.   KETOCONAZOLE (NIZORAL) 2 % CREAM    Apply 1 application topically as directed.   LISINOPRIL (PRINIVIL,ZESTRIL) 5 MG TABLET    Take 1 tablet by mouth daily.   LORATADINE (CLARITIN) 10 MG TABLET    Take 10 mg by mouth daily as needed for allergies.   NITROGLYCERIN (NITROSTAT) 0.4 MG SL TABLET    Take 1 tablet by mouth as directed.   POTASSIUM CHLORIDE (MICRO-K) 10 MEQ CR CAPSULE    Take 1 capsule by mouth daily.   SENNA (SENOKOT) 8.6 MG TABS TABLET    Take 1-2 tablets (8.6-17.2 mg total) by mouth daily as needed for mild constipation.   SIMVASTATIN (ZOCOR) 20 MG TABLET    Take 1 tablet by mouth daily.    VITAMIN B-12 (CYANOCOBALAMIN) 100 MCG TABLET    Take 100 mcg by mouth daily.  Modified Medications   No medications on file  Discontinued Medications   No medications on file     Allergies  Allergen Reactions  . Ibuprofen Swelling     REVIEW OF SYSTEMS:  GENERAL: no change in appetite, no fatigue, no weight changes, no fever, chills or weakness EYES: Denies change in vision, dry eyes, eye pain, itching or discharge EARS: Denies change in hearing, ringing in ears, or earache NOSE: Denies nasal congestion or epistaxis MOUTH and THROAT: Denies oral discomfort, gingival pain or bleeding, pain from teeth or hoarseness   RESPIRATORY: no cough, SOB, DOE, wheezing, hemoptysis CARDIAC: no chest pain, edema or palpitations GI: no abdominal pain, diarrhea, heart burn, nausea or vomiting, +constipation GU: Denies dysuria, frequency, hematuria, incontinence, or discharge PSYCHIATRIC: Denies feeling of depression or anxiety. No report of hallucinations, insomnia, paranoia, or agitation    PHYSICAL EXAMINATION  GENERAL APPEARANCE: Well nourished. In no acute distress. Normal body habitus SKIN:  Skin is warm and dry.  HEAD: Normal in size and contour. No evidence of trauma EYES: Lids open and close normally. No blepharitis, entropion or ectropion. PERRL. Conjunctivae are clear and sclerae are white. Lenses are without opacity EARS: Pinnae are normal. Patient hears normal voice tunes of the examiner MOUTH and THROAT: Lips are without lesions. Oral mucosa is moist and without lesions. Tongue is normal in shape, size, and color and without lesions NECK: supple, trachea midline, no neck masses, no thyroid tenderness, no thyromegaly LYMPHATICS: no LAN in the neck, no supraclavicular LAN RESPIRATORY: breathing is even & unlabored, BS CTAB CARDIAC: RRR, no murmur,no extra heart sounds, no edema GI: abdomen soft, normal BS, no masses, no tenderness, no hepatomegaly, no splenomegaly EXTREMITIES:   Able to move X 4 extremities; BLE weaknes, unable to stand-up on his own PSYCHIATRIC: Alert and oriented X 3. Affect and behavior are appropriate  LABS/RADIOLOGY: Labs reviewed: Basic Metabolic Panel:  Recent Labs  16/10/96 0954 02/02/16 0149  NA 139 134*  K 4.4 3.6  CL 101 98*  CO2 29 27  GLUCOSE 131* 129*  BUN 20 10  CREATININE 0.90 0.82  CALCIUM 9.3 9.2   Liver Function Tests:  Recent Labs  01/29/16 0954  AST 21  ALT 17  ALKPHOS 54  BILITOT 1.2  PROT 6.8  ALBUMIN 4.1    Recent Labs  01/29/16 0954  LIPASE 20   CBC:  Recent Labs  01/29/16 0954 02/02/16 0149  WBC 12.2* 7.6  HGB 14.3 13.7  HCT 42.7 40.9  MCV 91.6 90.9  PLT 211 218   Lipid Panel:  Recent Labs  01/30/16 0636  HDL 34*       Ct Angio Head W Or Wo Contrast  Result Date: 01/30/2016 CLINICAL DATA:  Follow-up posterior circulation stroke. History of hypertension, hyperlipidemia and CABG. EXAM: CT ANGIOGRAPHY HEAD AND NECK TECHNIQUE: Multidetector CT imaging of the head and neck was performed using the standard protocol during bolus administration of intravenous contrast. Multiplanar CT image reconstructions and MIPs were obtained to evaluate the vascular anatomy. Carotid stenosis measurements (when applicable) are obtained utilizing NASCET criteria, using the distal internal carotid diameter as the denominator. CONTRAST:  50 cc Isovue 370 COMPARISON:  MRI of the head January 29, 2016 FINDINGS: CT HEAD BRAIN: Wedge-like hypodensity RIGHT inferior cerebellum with folia effacement. Mild mass effect on the obex, fourth ventricle remains open. The ventricles and sulci are normal for age. No intraparenchymal hemorrhage, mass effect nor midline shift. Patchy supratentorial white matter hypodensities less than expected for patient's age, though non-specific are most compatible with chronic small vessel ischemic disease. No abnormal extra-axial fluid collections. Basal cisterns are patent. VASCULAR:  Severe calcific atherosclerosis of the carotid siphons. Moderate calcific atherosclerosis of bilateral vertebral arteries. SKULL: No skull fracture. No significant scalp soft tissue swelling. SINUSES/ORBITS: The mastoid air-cells and included paranasal sinuses are well-aerated. Status post bilateral ocular lens implants. The included ocular globes and orbital contents are non-suspicious. OTHER: None. CTA NECK AORTIC ARCH: Normal appearance of the thoracic arch, normal branch pattern. Moderate calcific atherosclerosis. Mild stenosis LEFT subclavian artery origin. Common origin of the innominate and LEFT Common carotid artery which is widely patent. RIGHT CAROTID SYSTEM: Common carotid artery is patent, coursing in a straight line fashion. Severe calcific atherosclerosis of the distal Common carotid artery. Normal appearance of the carotid bifurcation without hemodynamically significant stenosis by NASCET criteria. Normal appearance of the included internal carotid artery. LEFT CAROTID SYSTEM: Common carotid artery is widely patent, coursing in a straight line fashion. Normal appearance of the carotid bifurcation without hemodynamically significant stenosis by NASCET criteria. Moderate eccentric calcific atherosclerosis. Normal appearance of the included internal carotid artery. VERTEBRAL ARTERIES:Codominant vertebral artery's. Normal appearance of the vertebral arteries, which appear widely patent. SKELETON: No acute osseous process though bone windows have not been submitted. Moderate C5-6 and C6-7 degenerative discs. Severe bilateral C5-6 and C6-7 neural foraminal narrowing. Moderate canal stenosis C5-6. OTHER NECK: Soft tissues of the neck are non-acute though, not tailored for evaluation. Punctate RIGHT parotid sialolith. CTA HEAD ANTERIOR CIRCULATION: Patent bilateral cervical, petrous, cavernous internal carotid arteries with mild stenosis the carotid siphon data heavy vascular calcifications. Bilateral  anterior and middle cerebral arteries are widely patent. Mild luminal regularity. No large vessel occlusion, dissection, contrast extravasation or aneurysm. POSTERIOR CIRCULATION: Normal appearance of the vertebral arteries, vertebrobasilar junction and basilar artery. Occluded RIGHT posterior-inferior cerebellar artery approximate 1 cm from the origin. Patent cerebral arteries with moderate tandem stenoses. No dissection, contrast extravasation or aneurysm. VENOUS SINUSES: Major dural venous sinuses are patent though not tailored for evaluation on this angiographic examination. ANATOMIC VARIANTS: None. DELAYED PHASE: No abnormal enhancement. IMPRESSION: CT HEAD: Evolving RIGHT posterior-inferior cerebellar artery territory acute infarct without hemorrhagic conversion. Otherwise negative CT HEAD for age. CTA NECK: Atherosclerosis without hemodynamically significant stenosis or acute vascular process. CTA HEAD: Acute RIGHT posterior-inferior cerebellar artery occlusion within 1 cm of the origin. Moderate tandem stenosis bilateral  posterior cerebral arteries compatible with atherosclerosis. Mild anterior circulation atherosclerosis. Severe calcific atherosclerosis of the carotid siphons. Electronically Signed   By: Awilda Metroourtnay  Bloomer M.D.   On: 01/30/2016 02:34   Dg Chest 2 View  Result Date: 01/29/2016 CLINICAL DATA:  Weakness EXAM: CHEST  2 VIEW COMPARISON:  05/19/2010 chest radiograph. FINDINGS: Sternotomy wires appear aligned and intact. Stable cardiomediastinal silhouette with top-normal heart size and aortic atherosclerosis. No pneumothorax. No pleural effusion. No pulmonary edema. No acute consolidative airspace disease. IMPRESSION: No active cardiopulmonary disease. Aortic atherosclerosis. Electronically Signed   By: Delbert PhenixJason A Poff M.D.   On: 01/29/2016 12:51   Ct Angio Neck W Or Wo Contrast  Result Date: 01/30/2016 CLINICAL DATA:  Follow-up posterior circulation stroke. History of hypertension,  hyperlipidemia and CABG. EXAM: CT ANGIOGRAPHY HEAD AND NECK TECHNIQUE: Multidetector CT imaging of the head and neck was performed using the standard protocol during bolus administration of intravenous contrast. Multiplanar CT image reconstructions and MIPs were obtained to evaluate the vascular anatomy. Carotid stenosis measurements (when applicable) are obtained utilizing NASCET criteria, using the distal internal carotid diameter as the denominator. CONTRAST:  50 cc Isovue 370 COMPARISON:  MRI of the head January 29, 2016 FINDINGS: CT HEAD BRAIN: Wedge-like hypodensity RIGHT inferior cerebellum with folia effacement. Mild mass effect on the obex, fourth ventricle remains open. The ventricles and sulci are normal for age. No intraparenchymal hemorrhage, mass effect nor midline shift. Patchy supratentorial white matter hypodensities less than expected for patient's age, though non-specific are most compatible with chronic small vessel ischemic disease. No abnormal extra-axial fluid collections. Basal cisterns are patent. VASCULAR: Severe calcific atherosclerosis of the carotid siphons. Moderate calcific atherosclerosis of bilateral vertebral arteries. SKULL: No skull fracture. No significant scalp soft tissue swelling. SINUSES/ORBITS: The mastoid air-cells and included paranasal sinuses are well-aerated. Status post bilateral ocular lens implants. The included ocular globes and orbital contents are non-suspicious. OTHER: None. CTA NECK AORTIC ARCH: Normal appearance of the thoracic arch, normal branch pattern. Moderate calcific atherosclerosis. Mild stenosis LEFT subclavian artery origin. Common origin of the innominate and LEFT Common carotid artery which is widely patent. RIGHT CAROTID SYSTEM: Common carotid artery is patent, coursing in a straight line fashion. Severe calcific atherosclerosis of the distal Common carotid artery. Normal appearance of the carotid bifurcation without hemodynamically significant  stenosis by NASCET criteria. Normal appearance of the included internal carotid artery. LEFT CAROTID SYSTEM: Common carotid artery is widely patent, coursing in a straight line fashion. Normal appearance of the carotid bifurcation without hemodynamically significant stenosis by NASCET criteria. Moderate eccentric calcific atherosclerosis. Normal appearance of the included internal carotid artery. VERTEBRAL ARTERIES:Codominant vertebral artery's. Normal appearance of the vertebral arteries, which appear widely patent. SKELETON: No acute osseous process though bone windows have not been submitted. Moderate C5-6 and C6-7 degenerative discs. Severe bilateral C5-6 and C6-7 neural foraminal narrowing. Moderate canal stenosis C5-6. OTHER NECK: Soft tissues of the neck are non-acute though, not tailored for evaluation. Punctate RIGHT parotid sialolith. CTA HEAD ANTERIOR CIRCULATION: Patent bilateral cervical, petrous, cavernous internal carotid arteries with mild stenosis the carotid siphon data heavy vascular calcifications. Bilateral anterior and middle cerebral arteries are widely patent. Mild luminal regularity. No large vessel occlusion, dissection, contrast extravasation or aneurysm. POSTERIOR CIRCULATION: Normal appearance of the vertebral arteries, vertebrobasilar junction and basilar artery. Occluded RIGHT posterior-inferior cerebellar artery approximate 1 cm from the origin. Patent cerebral arteries with moderate tandem stenoses. No dissection, contrast extravasation or aneurysm. VENOUS SINUSES: Major dural venous sinuses are patent though  not tailored for evaluation on this angiographic examination. ANATOMIC VARIANTS: None. DELAYED PHASE: No abnormal enhancement. IMPRESSION: CT HEAD: Evolving RIGHT posterior-inferior cerebellar artery territory acute infarct without hemorrhagic conversion. Otherwise negative CT HEAD for age. CTA NECK: Atherosclerosis without hemodynamically significant stenosis or acute vascular  process. CTA HEAD: Acute RIGHT posterior-inferior cerebellar artery occlusion within 1 cm of the origin. Moderate tandem stenosis bilateral posterior cerebral arteries compatible with atherosclerosis. Mild anterior circulation atherosclerosis. Severe calcific atherosclerosis of the carotid siphons. Electronically Signed   By: Awilda Metro M.D.   On: 01/30/2016 02:34   Mr Brain Wo Contrast  Result Date: 01/29/2016 CLINICAL DATA:  Dizziness with gait disturbance. Nausea, vomiting, and weakness for 2 days. EXAM: MRI HEAD WITHOUT CONTRAST TECHNIQUE: Multiplanar, multiecho pulse sequences of the brain and surrounding structures were obtained without intravenous contrast. COMPARISON:  05/23/2010 FINDINGS: Brain: There is a large acute right cerebellar PICA territory infarct. There is associated cytotoxic edema without significant posterior fossa mass effect or evidence of associated hemorrhage. There is a punctate acute infarct versus artifact in the right lower pons, not confirmed on coronal imaging. Partially empty sella is unchanged. Chronic microhemorrhages are noted in posterior right frontal cortex and left centrum semiovale. No mass, midline shift, or extra-axial fluid collection is seen. There is age-appropriate cerebral atrophy. Deep cerebral white matter T2 hyperintensities have mildly progressed from 2012 and are nonspecific but compatible with mild chronic small vessel ischemic disease. Vascular: Major intracranial vascular flow voids are preserved. Skull and upper cervical spine: No suspicious osseous lesion. Sinuses/Orbits: Prior bilateral cataract extraction.  Clear sinuses. Other: None. IMPRESSION: 1. Acute large right cerebellar PICA territory infarct. 2. Punctate acute infarct versus artifact in the pons. 3. Mild chronic small vessel ischemic disease. Electronically Signed   By: Sebastian Ache M.D.   On: 01/29/2016 15:45    ASSESSMENT/PLAN:  Unsteady gait - for rehabilitation, PT and OT, for  therapeutic strengthening exercises; fall precaution  Acute CVA -  for rehabilitation, PT and OT, for therapeutic strengthening exercises; Neurology was consulted and recommended ASA 81 mg q d + Plavix 75 mg Q D X 3 months then Plavix alone; check CBC; follow-up with neurology, Dr. Roda Shutters  Constipation - discontinue Senna-S PRN and Colace; start Senna-S 8.6 mg 2 tabs PO BID  Glaucoma - continue Lumigan 0.01% 1 gtt to both eyes Q HS and Combigan 0.2-0.5% 1 gtt into both eyes Q 12 hours  BPH - continue Proscar 5 mg 1 tab PO Q D  Chronic diastolic heart failure - no SOB; continue Lasix 40 mg 1 tab PO Q D; check BMP  Hypertension - continue Lisinopril 5 mg 1 tab PO Q D  Allergic rhinitis - continue Claritin 10 mg 1 tab PO Q D PRN  CAD with Hx CABG - stable; continue NTG PRN  Hypokalemia - continue KCL ER 10 meq 1 tab PO daily  Hyperlipidemia - continue Zocor 20 mg 1 tab PO Q D Lab Results  Component Value Date   CHOL 126 01/30/2016   HDL 34 (L) 01/30/2016   LDLCALC 59 01/30/2016   TRIG 166 (H) 01/30/2016   CHOLHDL 3.7 01/30/2016   Vitamin B 12 deficiency - continue Vitamin B12 100 mcg daily     Goals of care:  Short-term rehabilitation     Kenard Gower, NP [facility] [phone]

## 2016-02-07 ENCOUNTER — Non-Acute Institutional Stay (SKILLED_NURSING_FACILITY): Payer: Medicare Other | Admitting: Internal Medicine

## 2016-02-07 ENCOUNTER — Encounter: Payer: Self-pay | Admitting: Internal Medicine

## 2016-02-07 DIAGNOSIS — R2681 Unsteadiness on feet: Secondary | ICD-10-CM | POA: Diagnosis not present

## 2016-02-07 DIAGNOSIS — N400 Enlarged prostate without lower urinary tract symptoms: Secondary | ICD-10-CM | POA: Diagnosis not present

## 2016-02-07 DIAGNOSIS — K5909 Other constipation: Secondary | ICD-10-CM

## 2016-02-07 DIAGNOSIS — I25709 Atherosclerosis of coronary artery bypass graft(s), unspecified, with unspecified angina pectoris: Secondary | ICD-10-CM | POA: Diagnosis not present

## 2016-02-07 DIAGNOSIS — I10 Essential (primary) hypertension: Secondary | ICD-10-CM | POA: Diagnosis not present

## 2016-02-07 DIAGNOSIS — I639 Cerebral infarction, unspecified: Secondary | ICD-10-CM | POA: Diagnosis not present

## 2016-02-07 DIAGNOSIS — E876 Hypokalemia: Secondary | ICD-10-CM | POA: Diagnosis not present

## 2016-02-07 DIAGNOSIS — J3089 Other allergic rhinitis: Secondary | ICD-10-CM

## 2016-02-07 DIAGNOSIS — E785 Hyperlipidemia, unspecified: Secondary | ICD-10-CM | POA: Diagnosis not present

## 2016-02-07 DIAGNOSIS — I5032 Chronic diastolic (congestive) heart failure: Secondary | ICD-10-CM

## 2016-02-07 LAB — CBC AND DIFFERENTIAL
HCT: 44 % (ref 41–53)
Hemoglobin: 14.3 g/dL (ref 13.5–17.5)
Neutrophils Absolute: 4 /uL
Platelets: 252 10*3/uL (ref 150–399)
WBC: 6.3 10*3/mL

## 2016-02-07 LAB — BASIC METABOLIC PANEL
BUN: 20 mg/dL (ref 4–21)
CREATININE: 0.8 mg/dL (ref 0.6–1.3)
GLUCOSE: 102 mg/dL
POTASSIUM: 4.4 mmol/L (ref 3.4–5.3)
Sodium: 141 mmol/L (ref 137–147)

## 2016-02-07 NOTE — Progress Notes (Signed)
LOCATION: [facility]  PCP: No PCP Per Patient   Code Status: DNR  Goals of care: Advanced Directive information Advanced Directives 02/06/2016  Does patient have an advance directive? Yes  Type of Advance Directive Out of facility DNR (pink MOST or yellow form)  Does patient want to make changes to advanced directive? No - Patient declined  Copy of advanced directive(s) in chart? Yes       Extended Emergency Contact Information Primary Emergency Contact: [name],[name] Address: [address]          [place], [place] Home Phone: [phone] Mobile Phone: [phone] Relation: Daughter   Allergies  Allergen Reactions  . Ibuprofen Swelling    Chief Complaint  Patient presents with  . New Admit To SNF    New Admission Visit     HPI:  Patient is a 82 y.o. male seen today for short term rehabilitation post hospital admission from 01/29/16-02/02/16 with acute cerebellar stroke noted on mRI brain. Neurology was consulted and recommended plavix and aspirin for 3 months and then plavix only. He has PMH of HTN, CAD, HLD, chronic diastolic CHF among others.    Review of Systems:  Constitutional: Negative for fever, chills HENT: Negative for headache, congestion, nasal discharge Eyes: Negative for blurred vision, double vision and discharge.  Respiratory: Negative for cough, shortness of breath and wheezing.   Cardiovascular: Negative for chest pain, palpitations, leg swelling.  Gastrointestinal: Negative for heartburn, nausea, vomiting, abdominal pain. Had bowel movement yesterday.  Genitourinary: Negative for dysuria and flank pain.  Musculoskeletal: Negative for back pain, fall.  Skin: Negative for itching, rash.  Neurological: Negative for dizziness. Psychiatric/Behavioral: Negative for depression   Past Medical History:  Diagnosis Date  . Acute CVA (cerebrovascular accident) (HCC) 01/2016  . Chronic diastolic CHF (congestive heart  failure) (HCC)   . Coronary artery disease   . Dehydration   . Glaucoma   . Hyperlipidemia   . Hypertension   . Stable angina [facility])    Past Surgical History:  Procedure Laterality Date  . CORONARY ARTERY BYPASS GRAFT     3-vessel around 1990.  Followed at [facility]   Social History:   reports that he has quit smoking. He has never used smokeless tobacco. He reports that he does not drink alcohol or use drugs.  Family History  Problem Relation Age of Onset  . CAD Mother   . Diabetes Mother   . Eczema Mother   . CAD Father     lived to 75 yo  . Stroke Father     Medications:   Medication List       Accurate as of 02/07/16  2:59 PM. Always use your most recent med list.          aspirin EC 81 MG tablet Take 1 tablet (81 mg total) by mouth daily. Continue aspirin and Plavix for 3 months and then Plavix only   brimonidine-timolol 0.2-0.5 % ophthalmic solution Commonly known as:  COMBIGAN Place 1 drop into both eyes every 12 (twelve) hours.   clopidogrel 75 MG tablet Commonly known as:  PLAVIX Take 1 tablet (75 mg total) by mouth daily.   finasteride 5 MG tablet Commonly known as:  PROSCAR Take 5 mg by mouth daily.   furosemide 40 MG tablet Commonly known as:  LASIX Take 1 tablet by mouth daily.   ketoconazole 2 % cream Commonly known as:  NIZORAL Apply 1 application topically as directed.   latanoprost  0.005 % ophthalmic solution Commonly known as:  XALATAN Place 1 drop into both eyes at bedtime.   lisinopril 5 MG tablet Commonly known as:  PRINIVIL,ZESTRIL Take 1 tablet by mouth daily.   loratadine 10 MG tablet Commonly known as:  CLARITIN Take 10 mg by mouth daily as needed for allergies.   NITROSTAT 0.4 MG SL tablet Generic drug:  nitroGLYCERIN Take 1 tablet by mouth as directed.   potassium chloride 10 MEQ CR capsule Commonly known as:  MICRO-K Take 1 capsule by mouth daily.   senna 8.6 MG tablet Commonly known as:  SENOKOT Take 2 tablets  by mouth 2 (two) times daily.   senna 8.6 MG tablet Commonly known as:  SENOKOT Take 1 tablet by mouth daily as needed for constipation.   simvastatin 20 MG tablet Commonly known as:  ZOCOR Take 1 tablet by mouth daily.   vitamin B-12 100 MCG tablet Commonly known as:  CYANOCOBALAMIN Take 100 mcg by mouth daily.   VITAMIN D-1000 MAX ST 1000 units tablet Generic drug:  Cholecalciferol Take 1 tablet by mouth daily.       Immunizations: Immunization History  Administered Date(s) Administered  . PPD Test 02/02/2016     Physical Exam:  Vitals:   02/07/16 1454  BP: 131/66  Pulse: 70  Resp: 18  Temp: (!) 96.4 F (35.8 C)  TempSrc: Oral  SpO2: 95%  Weight: 167 lb (75.8 kg)  Height: 5\' 8"  (1.727 m)   Body mass index is 25.39 kg/m.  General- elderly male, well built, in no acute distress Head- normocephalic, atraumatic Nose- no nasal discharge Throat- moist mucus membrane Eyes- PERRLA, EOMI, no pallor, no icterus Neck- no cervical lymphadenopathy Cardiovascular- normal s1,s2, no murmur, no leg edema Respiratory- bilateral clear to auscultation, no wheeze, no rhonchi, no crackles, no use of accessory muscles Abdomen- bowel sounds present, soft, non tender Musculoskeletal- able to move all 4 extremities, generalized weakness Neurological- alert and oriented to person, place and time Skin- warm and dry Psychiatry- normal mood and affect    Labs reviewed: Basic Metabolic Panel:  Recent Labs  16/02/9608/24/17 0954 02/02/16 0149  NA 139 134*  K 4.4 3.6  CL 101 98*  CO2 29 27  GLUCOSE 131* 129*  BUN 20 10  CREATININE 0.90 0.82  CALCIUM 9.3 9.2   Liver Function Tests:  Recent Labs  01/29/16 0954  AST 21  ALT 17  ALKPHOS 54  BILITOT 1.2  PROT 6.8  ALBUMIN 4.1    Recent Labs  01/29/16 0954  LIPASE 20   No results for input(s): AMMONIA in the last 8760 hours. CBC:  Recent Labs  01/29/16 0954 02/02/16 0149  WBC 12.2* 7.6  HGB 14.3 13.7  HCT  42.7 40.9  MCV 91.6 90.9  PLT 211 218   Cardiac Enzymes: No results for input(s): CKTOTAL, CKMB, CKMBINDEX, TROPONINI in the last 8760 hours. BNP: Invalid input(s): POCBNP CBG: No results for input(s): GLUCAP in the last 8760 hours.  Radiological Exams: Ct Angio Head W Or Wo Contrast  Result Date: 01/30/2016 CLINICAL DATA:  Follow-up posterior circulation stroke. History of hypertension, hyperlipidemia and CABG. EXAM: CT ANGIOGRAPHY HEAD AND NECK TECHNIQUE: Multidetector CT imaging of the head and neck was performed using the standard protocol during bolus administration of intravenous contrast. Multiplanar CT image reconstructions and MIPs were obtained to evaluate the vascular anatomy. Carotid stenosis measurements (when applicable) are obtained utilizing NASCET criteria, using the distal internal carotid diameter as the denominator. CONTRAST:  50 cc Isovue 370 COMPARISON:  MRI of the head January 29, 2016 FINDINGS: CT HEAD BRAIN: Wedge-like hypodensity RIGHT inferior cerebellum with folia effacement. Mild mass effect on the obex, fourth ventricle remains open. The ventricles and sulci are normal for age. No intraparenchymal hemorrhage, mass effect nor midline shift. Patchy supratentorial white matter hypodensities less than expected for patient's age, though non-specific are most compatible with chronic small vessel ischemic disease. No abnormal extra-axial fluid collections. Basal cisterns are patent. VASCULAR: Severe calcific atherosclerosis of the carotid siphons. Moderate calcific atherosclerosis of bilateral vertebral arteries. SKULL: No skull fracture. No significant scalp soft tissue swelling. SINUSES/ORBITS: The mastoid air-cells and included paranasal sinuses are well-aerated. Status post bilateral ocular lens implants. The included ocular globes and orbital contents are non-suspicious. OTHER: None. CTA NECK AORTIC ARCH: Normal appearance of the thoracic arch, normal branch pattern.  Moderate calcific atherosclerosis. Mild stenosis LEFT subclavian artery origin. Common origin of the innominate and LEFT Common carotid artery which is widely patent. RIGHT CAROTID SYSTEM: Common carotid artery is patent, coursing in a straight line fashion. Severe calcific atherosclerosis of the distal Common carotid artery. Normal appearance of the carotid bifurcation without hemodynamically significant stenosis by NASCET criteria. Normal appearance of the included internal carotid artery. LEFT CAROTID SYSTEM: Common carotid artery is widely patent, coursing in a straight line fashion. Normal appearance of the carotid bifurcation without hemodynamically significant stenosis by NASCET criteria. Moderate eccentric calcific atherosclerosis. Normal appearance of the included internal carotid artery. VERTEBRAL ARTERIES:Codominant vertebral artery's. Normal appearance of the vertebral arteries, which appear widely patent. SKELETON: No acute osseous process though bone windows have not been submitted. Moderate C5-6 and C6-7 degenerative discs. Severe bilateral C5-6 and C6-7 neural foraminal narrowing. Moderate canal stenosis C5-6. OTHER NECK: Soft tissues of the neck are non-acute though, not tailored for evaluation. Punctate RIGHT parotid sialolith. CTA HEAD ANTERIOR CIRCULATION: Patent bilateral cervical, petrous, cavernous internal carotid arteries with mild stenosis the carotid siphon data heavy vascular calcifications. Bilateral anterior and middle cerebral arteries are widely patent. Mild luminal regularity. No large vessel occlusion, dissection, contrast extravasation or aneurysm. POSTERIOR CIRCULATION: Normal appearance of the vertebral arteries, vertebrobasilar junction and basilar artery. Occluded RIGHT posterior-inferior cerebellar artery approximate 1 cm from the origin. Patent cerebral arteries with moderate tandem stenoses. No dissection, contrast extravasation or aneurysm. VENOUS SINUSES: Major dural  venous sinuses are patent though not tailored for evaluation on this angiographic examination. ANATOMIC VARIANTS: None. DELAYED PHASE: No abnormal enhancement. IMPRESSION: CT HEAD: Evolving RIGHT posterior-inferior cerebellar artery territory acute infarct without hemorrhagic conversion. Otherwise negative CT HEAD for age. CTA NECK: Atherosclerosis without hemodynamically significant stenosis or acute vascular process. CTA HEAD: Acute RIGHT posterior-inferior cerebellar artery occlusion within 1 cm of the origin. Moderate tandem stenosis bilateral posterior cerebral arteries compatible with atherosclerosis. Mild anterior circulation atherosclerosis. Severe calcific atherosclerosis of the carotid siphons. Electronically Signed   By: Awilda Metro M.D.   On: 01/30/2016 02:34   Dg Chest 2 View  Result Date: 01/29/2016 CLINICAL DATA:  Weakness EXAM: CHEST  2 VIEW COMPARISON:  05/19/2010 chest radiograph. FINDINGS: Sternotomy wires appear aligned and intact. Stable cardiomediastinal silhouette with top-normal heart size and aortic atherosclerosis. No pneumothorax. No pleural effusion. No pulmonary edema. No acute consolidative airspace disease. IMPRESSION: No active cardiopulmonary disease. Aortic atherosclerosis. Electronically Signed   By: Delbert Phenix M.D.   On: 01/29/2016 12:51   Ct Angio Neck W Or Wo Contrast  Result Date: 01/30/2016 CLINICAL DATA:  Follow-up posterior circulation stroke. History of  hypertension, hyperlipidemia and CABG. EXAM: CT ANGIOGRAPHY HEAD AND NECK TECHNIQUE: Multidetector CT imaging of the head and neck was performed using the standard protocol during bolus administration of intravenous contrast. Multiplanar CT image reconstructions and MIPs were obtained to evaluate the vascular anatomy. Carotid stenosis measurements (when applicable) are obtained utilizing NASCET criteria, using the distal internal carotid diameter as the denominator. CONTRAST:  50 cc Isovue 370 COMPARISON:   MRI of the head January 29, 2016 FINDINGS: CT HEAD BRAIN: Wedge-like hypodensity RIGHT inferior cerebellum with folia effacement. Mild mass effect on the obex, fourth ventricle remains open. The ventricles and sulci are normal for age. No intraparenchymal hemorrhage, mass effect nor midline shift. Patchy supratentorial white matter hypodensities less than expected for patient's age, though non-specific are most compatible with chronic small vessel ischemic disease. No abnormal extra-axial fluid collections. Basal cisterns are patent. VASCULAR: Severe calcific atherosclerosis of the carotid siphons. Moderate calcific atherosclerosis of bilateral vertebral arteries. SKULL: No skull fracture. No significant scalp soft tissue swelling. SINUSES/ORBITS: The mastoid air-cells and included paranasal sinuses are well-aerated. Status post bilateral ocular lens implants. The included ocular globes and orbital contents are non-suspicious. OTHER: None. CTA NECK AORTIC ARCH: Normal appearance of the thoracic arch, normal branch pattern. Moderate calcific atherosclerosis. Mild stenosis LEFT subclavian artery origin. Common origin of the innominate and LEFT Common carotid artery which is widely patent. RIGHT CAROTID SYSTEM: Common carotid artery is patent, coursing in a straight line fashion. Severe calcific atherosclerosis of the distal Common carotid artery. Normal appearance of the carotid bifurcation without hemodynamically significant stenosis by NASCET criteria. Normal appearance of the included internal carotid artery. LEFT CAROTID SYSTEM: Common carotid artery is widely patent, coursing in a straight line fashion. Normal appearance of the carotid bifurcation without hemodynamically significant stenosis by NASCET criteria. Moderate eccentric calcific atherosclerosis. Normal appearance of the included internal carotid artery. VERTEBRAL ARTERIES:Codominant vertebral artery's. Normal appearance of the vertebral arteries,  which appear widely patent. SKELETON: No acute osseous process though bone windows have not been submitted. Moderate C5-6 and C6-7 degenerative discs. Severe bilateral C5-6 and C6-7 neural foraminal narrowing. Moderate canal stenosis C5-6. OTHER NECK: Soft tissues of the neck are non-acute though, not tailored for evaluation. Punctate RIGHT parotid sialolith. CTA HEAD ANTERIOR CIRCULATION: Patent bilateral cervical, petrous, cavernous internal carotid arteries with mild stenosis the carotid siphon data heavy vascular calcifications. Bilateral anterior and middle cerebral arteries are widely patent. Mild luminal regularity. No large vessel occlusion, dissection, contrast extravasation or aneurysm. POSTERIOR CIRCULATION: Normal appearance of the vertebral arteries, vertebrobasilar junction and basilar artery. Occluded RIGHT posterior-inferior cerebellar artery approximate 1 cm from the origin. Patent cerebral arteries with moderate tandem stenoses. No dissection, contrast extravasation or aneurysm. VENOUS SINUSES: Major dural venous sinuses are patent though not tailored for evaluation on this angiographic examination. ANATOMIC VARIANTS: None. DELAYED PHASE: No abnormal enhancement. IMPRESSION: CT HEAD: Evolving RIGHT posterior-inferior cerebellar artery territory acute infarct without hemorrhagic conversion. Otherwise negative CT HEAD for age. CTA NECK: Atherosclerosis without hemodynamically significant stenosis or acute vascular process. CTA HEAD: Acute RIGHT posterior-inferior cerebellar artery occlusion within 1 cm of the origin. Moderate tandem stenosis bilateral posterior cerebral arteries compatible with atherosclerosis. Mild anterior circulation atherosclerosis. Severe calcific atherosclerosis of the carotid siphons. Electronically Signed   By: Awilda Metro M.D.   On: 01/30/2016 02:34   Mr Brain Wo Contrast  Result Date: 01/29/2016 CLINICAL DATA:  Dizziness with gait disturbance. Nausea, vomiting,  and weakness for 2 days. EXAM: MRI HEAD WITHOUT CONTRAST TECHNIQUE: Multiplanar,  multiecho pulse sequences of the brain and surrounding structures were obtained without intravenous contrast. COMPARISON:  05/23/2010 FINDINGS: Brain: There is a large acute right cerebellar PICA territory infarct. There is associated cytotoxic edema without significant posterior fossa mass effect or evidence of associated hemorrhage. There is a punctate acute infarct versus artifact in the right lower pons, not confirmed on coronal imaging. Partially empty sella is unchanged. Chronic microhemorrhages are noted in posterior right frontal cortex and left centrum semiovale. No mass, midline shift, or extra-axial fluid collection is seen. There is age-appropriate cerebral atrophy. Deep cerebral white matter T2 hyperintensities have mildly progressed from 2012 and are nonspecific but compatible with mild chronic small vessel ischemic disease. Vascular: Major intracranial vascular flow voids are preserved. Skull and upper cervical spine: No suspicious osseous lesion. Sinuses/Orbits: Prior bilateral cataract extraction.  Clear sinuses. Other: None. IMPRESSION: 1. Acute large right cerebellar PICA territory infarct. 2. Punctate acute infarct versus artifact in the pons. 3. Mild chronic small vessel ischemic disease. Electronically Signed   By: Sebastian Ache M.D.   On: 01/29/2016 15:45    Assessment/Plan   Gait instability Post CVA and with generalized weakness. Will have patient work with PT/OT as tolerated to regain strength and restore function.  Fall precautions are in place.  Acute CVA Continue aspirin 81 mg daily with plavix 75 mg daily x 3 months and then plavix only. To follow up with neurology. Will have him work with physical therapy and occupational therapy team to help with gait training and muscle strengthening exercises.fall precautions. Skin care. Encourage to be out of bed.   Chronic diastolic chf Continue lisinopril  and lasix for now and monitor  CAD S/p CABG. Chest pain free. Continue aspirin, lisinopril and prn NTG. continue statin  BPH Continue proscar  Constipation Continue senna s bid and monitor  Hypertension Monitor bp, continue lisinopril  Allergic rhinitis continue claritin 10 mg daily as needed  HLD Continue simvastatin  Hypokalemia  Continue kcl with him on lasix, check bmp    Goals of care: short term rehabilitation   Labs/tests ordered: cbc, bmp  Family/ staff Communication: reviewed care plan with patient and nursing supervisor    Oneal Grout, MD Internal Medicine [facility] [address] [place], [place] [address] Cell Phone (Monday-Friday 8 am - 5 pm): [phone] On Call: [phone] and follow prompts after 5 pm and on weekends Office Phone: [phone] Office Fax: [phone]

## 2016-02-08 ENCOUNTER — Telehealth: Payer: Self-pay | Admitting: Neurology

## 2016-02-08 NOTE — Telephone Encounter (Signed)
Rn spoke with Dr. Roda ShuttersXu. Pt can be schedule with Dr .Marjory LiesPenumalli for hospital stroke follow up.

## 2016-02-08 NOTE — Telephone Encounter (Signed)
Tracy/[facility] [phone] ext [phone] called to schedule 6 week hospital f/u for stroke. Please advise if can schedule w/another provider as 1st availability is in January.

## 2016-02-08 NOTE — Telephone Encounter (Signed)
Sounds great. Thanks.   Marvel PlanJindong Daniil Labarge, MD PhD Stroke Neurology 02/08/2016 5:05 PM

## 2016-02-08 NOTE — Telephone Encounter (Signed)
PT schedule by phone staff with Dr .Denyse AmassPenuamalli.

## 2016-02-20 ENCOUNTER — Encounter: Payer: Self-pay | Admitting: Adult Health

## 2016-02-20 ENCOUNTER — Non-Acute Institutional Stay (SKILLED_NURSING_FACILITY): Payer: Medicare Other | Admitting: Adult Health

## 2016-02-20 DIAGNOSIS — K5901 Slow transit constipation: Secondary | ICD-10-CM

## 2016-02-20 DIAGNOSIS — I5032 Chronic diastolic (congestive) heart failure: Secondary | ICD-10-CM | POA: Diagnosis not present

## 2016-02-20 DIAGNOSIS — I25709 Atherosclerosis of coronary artery bypass graft(s), unspecified, with unspecified angina pectoris: Secondary | ICD-10-CM | POA: Diagnosis not present

## 2016-02-20 DIAGNOSIS — I10 Essential (primary) hypertension: Secondary | ICD-10-CM

## 2016-02-20 DIAGNOSIS — H409 Unspecified glaucoma: Secondary | ICD-10-CM | POA: Diagnosis not present

## 2016-02-20 DIAGNOSIS — I639 Cerebral infarction, unspecified: Secondary | ICD-10-CM | POA: Diagnosis not present

## 2016-02-20 DIAGNOSIS — D518 Other vitamin B12 deficiency anemias: Secondary | ICD-10-CM | POA: Diagnosis not present

## 2016-02-20 DIAGNOSIS — J3089 Other allergic rhinitis: Secondary | ICD-10-CM

## 2016-02-20 DIAGNOSIS — R2681 Unsteadiness on feet: Secondary | ICD-10-CM | POA: Diagnosis not present

## 2016-02-20 DIAGNOSIS — E785 Hyperlipidemia, unspecified: Secondary | ICD-10-CM | POA: Diagnosis not present

## 2016-02-20 DIAGNOSIS — E876 Hypokalemia: Secondary | ICD-10-CM

## 2016-02-20 NOTE — Progress Notes (Signed)
Patient ID: Bruce Clarke, male   DOB: Aug 31, 1916, 82 y.o.   MRN: [id number]     DATE:  02/20/2016   MRN:  [id number]  BIRTHDAY: Mar 01, 1917  Facility:  Nursing Home Location:  [facility]  Nursing Home Room Number: 706-P  LEVEL OF CARE:  SNF (31)  Contact Information    Name Relation Home Work Mobile   [name],[name] Daughter [phone]  [phone]   No name specified           Code Status History    Date Active Date Inactive Code Status Order ID Comments User Context   01/29/2016  8:50 PM 02/02/2016  7:39 PM Partial Code [id number]  Renae Fickle, MD Inpatient    Questions for Most Recent Historical Code Status (Order [id number])    Question Answer Comment   In the event of cardiac or respiratory ARREST: Initiate Code Blue, Call Rapid Response Yes    In the event of cardiac or respiratory ARREST: Perform CPR No    In the event of cardiac or respiratory ARREST: Perform Intubation/Mechanical Ventilation Yes    In the event of cardiac or respiratory ARREST: Use NIPPV/BiPAp only if indicated Yes    In the event of cardiac or respiratory ARREST: Administer ACLS medications if indicated Yes    In the event of cardiac or respiratory ARREST: Perform Defibrillation or Cardioversion if indicated No         Advance Directive Documentation   Flowsheet Row Most Recent Value  Type of Advance Directive  Out of facility DNR (pink MOST or yellow form)  Pre-existing out of facility DNR order (yellow form or pink MOST form)  No data  "MOST" Form in Place?  No data       Chief Complaint  Patient presents with  . Discharge Note    HISTORY OF PRESENT ILLNESS:  This is a 82 year old male who is for discharge home with Home health PT, OT, CNA and Nursing. DME:  Standard wheelchair, cushion, brake extensions, anti-tippers and elevating leg rests.  He has been admitted to [facility] on 02/02/16 from [facility] with acute cerebellar stroke. MRI of the brain  showed acute large right cerebellar PICA infarct, likely embolic stroke. Neurology was consulted and recommended ASA 81 mg q d + Plavix 75 mg Q D X 3 months then Plavix alone.  Patient was admitted to this facility for short-term rehabilitation after the patient's recent hospitalization.  Patient has completed SNF rehabilitation and therapy has cleared the patient for discharge.   PAST MEDICAL HISTORY:  Past Medical History:  Diagnosis Date  . Acute CVA (cerebrovascular accident) (HCC) 01/2016  . Chronic diastolic CHF (congestive heart failure) (HCC)   . Coronary artery disease   . Dehydration   . Glaucoma   . Hyperlipidemia   . Hypertension   . Stable angina (HCC)      CURRENT MEDICATIONS: Reviewed  Patient's Medications  New Prescriptions   No medications on file  Previous Medications   ASPIRIN EC 81 MG TABLET    Take 1 tablet (81 mg total) by mouth daily. Continue aspirin and Plavix for 3 months and then Plavix only   BRIMONIDINE-TIMOLOL (COMBIGAN) 0.2-0.5 % OPHTHALMIC SOLUTION    Place 1 drop into both eyes every 12 (twelve) hours.   CHOLECALCIFEROL (VITAMIN D-1000 MAX ST) 1000 UNITS TABLET    Take 1 tablet by mouth daily.   CLOPIDOGREL (PLAVIX) 75 MG TABLET    Take 1 tablet (75 mg total)  by mouth daily.   FINASTERIDE (PROSCAR) 5 MG TABLET    Take 5 mg by mouth daily.   FUROSEMIDE (LASIX) 40 MG TABLET    Take 1 tablet by mouth daily.   KETOCONAZOLE (NIZORAL) 2 % CREAM    Apply 1 application topically as directed.   LATANOPROST (XALATAN) 0.005 % OPHTHALMIC SOLUTION    Place 1 drop into both eyes at bedtime.   LISINOPRIL (PRINIVIL,ZESTRIL) 5 MG TABLET    Take 1 tablet by mouth daily.    LORATADINE (CLARITIN) 10 MG TABLET    Take 10 mg by mouth daily as needed for allergies.   NITROGLYCERIN (NITROSTAT) 0.4 MG SL TABLET    Take 1 tablet by mouth as directed.   POTASSIUM CHLORIDE (MICRO-K) 10 MEQ CR CAPSULE    Take 1 capsule by mouth daily.   SENNOSIDES-DOCUSATE SODIUM (SENOKOT-S)  8.6-50 MG TABLET    Take 2 tablets by mouth 2 (two) times daily.   SIMVASTATIN (ZOCOR) 20 MG TABLET    Take 1 tablet by mouth daily.    VITAMIN B-12 (CYANOCOBALAMIN) 100 MCG TABLET    Take 100 mcg by mouth daily.  Modified Medications   No medications on file  Discontinued Medications   SENNA (SENOKOT) 8.6 MG TABLET    Take 2 tablets by mouth 2 (two) times daily.    SENNA (SENOKOT) 8.6 MG TABLET    Take 1 tablet by mouth daily as needed for constipation.     Allergies  Allergen Reactions  . Ibuprofen Swelling     REVIEW OF SYSTEMS:  GENERAL: no change in appetite, no fatigue, no weight changes, no fever, chills or weakness EYES: Denies change in vision, dry eyes, eye pain, itching or discharge EARS: Denies change in hearing, ringing in ears, or earache NOSE: Denies nasal congestion or epistaxis MOUTH and THROAT: Denies oral discomfort, gingival pain or bleeding, pain from teeth or hoarseness   RESPIRATORY: no cough, SOB, DOE, wheezing, hemoptysis CARDIAC: no chest pain, edema or palpitations GI: no abdominal pain, diarrhea, heart burn, nausea or vomiting, +constipation GU: Denies dysuria, frequency, hematuria, incontinence, or discharge PSYCHIATRIC: Denies feeling of depression or anxiety. No report of hallucinations, insomnia, paranoia, or agitation    PHYSICAL EXAMINATION  GENERAL APPEARANCE: Well nourished. In no acute distress. Normal body habitus SKIN:  Skin is warm and dry.  HEAD: Normal in size and contour. No evidence of trauma EYES: Lids open and close normally. No blepharitis, entropion or ectropion. PERRL. Conjunctivae are clear and sclerae are white. Lenses are without opacity EARS: Pinnae are normal. Patient hears normal voice tunes of the examiner MOUTH and THROAT: Lips are without lesions. Oral mucosa is moist and without lesions. Tongue is normal in shape, size, and color and without lesions NECK: supple, trachea midline, no neck masses, no thyroid tenderness,  no thyromegaly LYMPHATICS: no LAN in the neck, no supraclavicular LAN RESPIRATORY: breathing is even & unlabored, BS CTAB CARDIAC: RRR, no murmur,no extra heart sounds, no edema GI: abdomen soft, normal BS, no masses, no tenderness, no hepatomegaly, no splenomegaly EXTREMITIES:  Able to move X 4 extremities; BLE weaknes PSYCHIATRIC: Alert and oriented X 3. Affect and behavior are appropriate  LABS/RADIOLOGY: Labs reviewed: Basic Metabolic Panel:  Recent Labs  16/10/96 0954 02/02/16 0149 02/07/16  NA 139 134* 141  K 4.4 3.6 4.4  CL 101 98*  --   CO2 29 27  --   GLUCOSE 131* 129*  --   BUN 20 10 20   CREATININE  0.90 0.82 0.8  CALCIUM 9.3 9.2  --    Liver Function Tests:  Recent Labs  01/29/16 0954  AST 21  ALT 17  ALKPHOS 54  BILITOT 1.2  PROT 6.8  ALBUMIN 4.1    Recent Labs  01/29/16 0954  LIPASE 20   CBC:  Recent Labs  01/29/16 0954 02/02/16 0149 02/07/16  WBC 12.2* 7.6 6.3  NEUTROABS  --   --  4  HGB 14.3 13.7 14.3  HCT 42.7 40.9 44  MCV 91.6 90.9  --   PLT 211 218 252   Lipid Panel:  Recent Labs  01/30/16 0636  HDL 34*       Ct Angio Head W Or Wo Contrast  Result Date: 01/30/2016 CLINICAL DATA:  Follow-up posterior circulation stroke. History of hypertension, hyperlipidemia and CABG. EXAM: CT ANGIOGRAPHY HEAD AND NECK TECHNIQUE: Multidetector CT imaging of the head and neck was performed using the standard protocol during bolus administration of intravenous contrast. Multiplanar CT image reconstructions and MIPs were obtained to evaluate the vascular anatomy. Carotid stenosis measurements (when applicable) are obtained utilizing NASCET criteria, using the distal internal carotid diameter as the denominator. CONTRAST:  50 cc Isovue 370 COMPARISON:  MRI of the head January 29, 2016 FINDINGS: CT HEAD BRAIN: Wedge-like hypodensity RIGHT inferior cerebellum with folia effacement. Mild mass effect on the obex, fourth ventricle remains open. The  ventricles and sulci are normal for age. No intraparenchymal hemorrhage, mass effect nor midline shift. Patchy supratentorial white matter hypodensities less than expected for patient's age, though non-specific are most compatible with chronic small vessel ischemic disease. No abnormal extra-axial fluid collections. Basal cisterns are patent. VASCULAR: Severe calcific atherosclerosis of the carotid siphons. Moderate calcific atherosclerosis of bilateral vertebral arteries. SKULL: No skull fracture. No significant scalp soft tissue swelling. SINUSES/ORBITS: The mastoid air-cells and included paranasal sinuses are well-aerated. Status post bilateral ocular lens implants. The included ocular globes and orbital contents are non-suspicious. OTHER: None. CTA NECK AORTIC ARCH: Normal appearance of the thoracic arch, normal branch pattern. Moderate calcific atherosclerosis. Mild stenosis LEFT subclavian artery origin. Common origin of the innominate and LEFT Common carotid artery which is widely patent. RIGHT CAROTID SYSTEM: Common carotid artery is patent, coursing in a straight line fashion. Severe calcific atherosclerosis of the distal Common carotid artery. Normal appearance of the carotid bifurcation without hemodynamically significant stenosis by NASCET criteria. Normal appearance of the included internal carotid artery. LEFT CAROTID SYSTEM: Common carotid artery is widely patent, coursing in a straight line fashion. Normal appearance of the carotid bifurcation without hemodynamically significant stenosis by NASCET criteria. Moderate eccentric calcific atherosclerosis. Normal appearance of the included internal carotid artery. VERTEBRAL ARTERIES:Codominant vertebral artery's. Normal appearance of the vertebral arteries, which appear widely patent. SKELETON: No acute osseous process though bone windows have not been submitted. Moderate C5-6 and C6-7 degenerative discs. Severe bilateral C5-6 and C6-7 neural foraminal  narrowing. Moderate canal stenosis C5-6. OTHER NECK: Soft tissues of the neck are non-acute though, not tailored for evaluation. Punctate RIGHT parotid sialolith. CTA HEAD ANTERIOR CIRCULATION: Patent bilateral cervical, petrous, cavernous internal carotid arteries with mild stenosis the carotid siphon data heavy vascular calcifications. Bilateral anterior and middle cerebral arteries are widely patent. Mild luminal regularity. No large vessel occlusion, dissection, contrast extravasation or aneurysm. POSTERIOR CIRCULATION: Normal appearance of the vertebral arteries, vertebrobasilar junction and basilar artery. Occluded RIGHT posterior-inferior cerebellar artery approximate 1 cm from the origin. Patent cerebral arteries with moderate tandem stenoses. No dissection, contrast extravasation or aneurysm.  VENOUS SINUSES: Major dural venous sinuses are patent though not tailored for evaluation on this angiographic examination. ANATOMIC VARIANTS: None. DELAYED PHASE: No abnormal enhancement. IMPRESSION: CT HEAD: Evolving RIGHT posterior-inferior cerebellar artery territory acute infarct without hemorrhagic conversion. Otherwise negative CT HEAD for age. CTA NECK: Atherosclerosis without hemodynamically significant stenosis or acute vascular process. CTA HEAD: Acute RIGHT posterior-inferior cerebellar artery occlusion within 1 cm of the origin. Moderate tandem stenosis bilateral posterior cerebral arteries compatible with atherosclerosis. Mild anterior circulation atherosclerosis. Severe calcific atherosclerosis of the carotid siphons. Electronically Signed   By: Awilda Metroourtnay  Bloomer M.D.   On: 01/30/2016 02:34   Dg Chest 2 View  Result Date: 01/29/2016 CLINICAL DATA:  Weakness EXAM: CHEST  2 VIEW COMPARISON:  05/19/2010 chest radiograph. FINDINGS: Sternotomy wires appear aligned and intact. Stable cardiomediastinal silhouette with top-normal heart size and aortic atherosclerosis. No pneumothorax. No pleural effusion. No  pulmonary edema. No acute consolidative airspace disease. IMPRESSION: No active cardiopulmonary disease. Aortic atherosclerosis. Electronically Signed   By: Delbert PhenixJason A Poff M.D.   On: 01/29/2016 12:51   Ct Angio Neck W Or Wo Contrast  Result Date: 01/30/2016 CLINICAL DATA:  Follow-up posterior circulation stroke. History of hypertension, hyperlipidemia and CABG. EXAM: CT ANGIOGRAPHY HEAD AND NECK TECHNIQUE: Multidetector CT imaging of the head and neck was performed using the standard protocol during bolus administration of intravenous contrast. Multiplanar CT image reconstructions and MIPs were obtained to evaluate the vascular anatomy. Carotid stenosis measurements (when applicable) are obtained utilizing NASCET criteria, using the distal internal carotid diameter as the denominator. CONTRAST:  50 cc Isovue 370 COMPARISON:  MRI of the head January 29, 2016 FINDINGS: CT HEAD BRAIN: Wedge-like hypodensity RIGHT inferior cerebellum with folia effacement. Mild mass effect on the obex, fourth ventricle remains open. The ventricles and sulci are normal for age. No intraparenchymal hemorrhage, mass effect nor midline shift. Patchy supratentorial white matter hypodensities less than expected for patient's age, though non-specific are most compatible with chronic small vessel ischemic disease. No abnormal extra-axial fluid collections. Basal cisterns are patent. VASCULAR: Severe calcific atherosclerosis of the carotid siphons. Moderate calcific atherosclerosis of bilateral vertebral arteries. SKULL: No skull fracture. No significant scalp soft tissue swelling. SINUSES/ORBITS: The mastoid air-cells and included paranasal sinuses are well-aerated. Status post bilateral ocular lens implants. The included ocular globes and orbital contents are non-suspicious. OTHER: None. CTA NECK AORTIC ARCH: Normal appearance of the thoracic arch, normal branch pattern. Moderate calcific atherosclerosis. Mild stenosis LEFT subclavian  artery origin. Common origin of the innominate and LEFT Common carotid artery which is widely patent. RIGHT CAROTID SYSTEM: Common carotid artery is patent, coursing in a straight line fashion. Severe calcific atherosclerosis of the distal Common carotid artery. Normal appearance of the carotid bifurcation without hemodynamically significant stenosis by NASCET criteria. Normal appearance of the included internal carotid artery. LEFT CAROTID SYSTEM: Common carotid artery is widely patent, coursing in a straight line fashion. Normal appearance of the carotid bifurcation without hemodynamically significant stenosis by NASCET criteria. Moderate eccentric calcific atherosclerosis. Normal appearance of the included internal carotid artery. VERTEBRAL ARTERIES:Codominant vertebral artery's. Normal appearance of the vertebral arteries, which appear widely patent. SKELETON: No acute osseous process though bone windows have not been submitted. Moderate C5-6 and C6-7 degenerative discs. Severe bilateral C5-6 and C6-7 neural foraminal narrowing. Moderate canal stenosis C5-6. OTHER NECK: Soft tissues of the neck are non-acute though, not tailored for evaluation. Punctate RIGHT parotid sialolith. CTA HEAD ANTERIOR CIRCULATION: Patent bilateral cervical, petrous, cavernous internal carotid arteries with mild stenosis  the carotid siphon data heavy vascular calcifications. Bilateral anterior and middle cerebral arteries are widely patent. Mild luminal regularity. No large vessel occlusion, dissection, contrast extravasation or aneurysm. POSTERIOR CIRCULATION: Normal appearance of the vertebral arteries, vertebrobasilar junction and basilar artery. Occluded RIGHT posterior-inferior cerebellar artery approximate 1 cm from the origin. Patent cerebral arteries with moderate tandem stenoses. No dissection, contrast extravasation or aneurysm. VENOUS SINUSES: Major dural venous sinuses are patent though not tailored for evaluation on this  angiographic examination. ANATOMIC VARIANTS: None. DELAYED PHASE: No abnormal enhancement. IMPRESSION: CT HEAD: Evolving RIGHT posterior-inferior cerebellar artery territory acute infarct without hemorrhagic conversion. Otherwise negative CT HEAD for age. CTA NECK: Atherosclerosis without hemodynamically significant stenosis or acute vascular process. CTA HEAD: Acute RIGHT posterior-inferior cerebellar artery occlusion within 1 cm of the origin. Moderate tandem stenosis bilateral posterior cerebral arteries compatible with atherosclerosis. Mild anterior circulation atherosclerosis. Severe calcific atherosclerosis of the carotid siphons. Electronically Signed   By: Awilda Metro M.D.   On: 01/30/2016 02:34   Mr Brain Wo Contrast  Result Date: 01/29/2016 CLINICAL DATA:  Dizziness with gait disturbance. Nausea, vomiting, and weakness for 2 days. EXAM: MRI HEAD WITHOUT CONTRAST TECHNIQUE: Multiplanar, multiecho pulse sequences of the brain and surrounding structures were obtained without intravenous contrast. COMPARISON:  05/23/2010 FINDINGS: Brain: There is a large acute right cerebellar PICA territory infarct. There is associated cytotoxic edema without significant posterior fossa mass effect or evidence of associated hemorrhage. There is a punctate acute infarct versus artifact in the right lower pons, not confirmed on coronal imaging. Partially empty sella is unchanged. Chronic microhemorrhages are noted in posterior right frontal cortex and left centrum semiovale. No mass, midline shift, or extra-axial fluid collection is seen. There is age-appropriate cerebral atrophy. Deep cerebral white matter T2 hyperintensities have mildly progressed from 2012 and are nonspecific but compatible with mild chronic small vessel ischemic disease. Vascular: Major intracranial vascular flow voids are preserved. Skull and upper cervical spine: No suspicious osseous lesion. Sinuses/Orbits: Prior bilateral cataract extraction.   Clear sinuses. Other: None. IMPRESSION: 1. Acute large right cerebellar PICA territory infarct. 2. Punctate acute infarct versus artifact in the pons. 3. Mild chronic small vessel ischemic disease. Electronically Signed   By: Sebastian Ache M.D.   On: 01/29/2016 15:45    ASSESSMENT/PLAN:  Unsteady gait - for Home health CNA, Nursing, PT and OT, for therapeutic strengthening exercises; fall precaution  Acute CVA -  for Home health CNA, Nursing, PT and OT, for therapeutic strengthening exercises; Neurology was consulted and recommended ASA 81 mg q d + Plavix 75 mg Q D X 3 months then Plavix alone; follow-up with neurology, Dr. Roda Shutters  Constipation - continue Senna-S 8.6-50 mg 2 tabs PO BID  Glaucoma - continue Lumigan 0.01% 1 gtt to both eyes Q HS and Combigan 0.2-0.5% 1 gtt into both eyes Q 12 hours  BPH - continue Proscar 5 mg 1 tab PO Q D  Chronic diastolic heart failure - no SOB; continue Lasix 40 mg 1 tab PO Q D  Hypertension - continue Lisinopril 5 mg 1 tab PO Q D  Allergic rhinitis - continue Claritin 10 mg 1 tab PO Q D PRN  CAD with Hx CABG - stable; continue NTG PRN  Hypokalemia - continue KCL ER 10 meq 1 tab PO daily Lab Results  Component Value Date   K 4.4 02/07/2016    Hyperlipidemia - continue Zocor 20 mg 1 tab PO Q D Lab Results  Component Value Date   CHOL  126 01/30/2016   HDL 34 (L) 01/30/2016   LDLCALC 59 01/30/2016   TRIG 166 (H) 01/30/2016   CHOLHDL 3.7 01/30/2016   Vitamin B 12 deficiency - continue Vitamin B12 100 mcg daily      I have filled out patient's discharge paperwork and written prescriptions.  Patient will receive home health PT, OT, Nursing and CNA.  DME provided:  Standard wheelchair, cushion, brake extensions, anti-tippers and elevating leg rests  Total discharge time: Greater than 30 minutes Greater than 50% was spent in counseling and coordination of care with the patient.   Discharge time involved coordination of the discharge process  with social worker, nursing staff and therapy department. Medical justification for home health services/DME verified.   Kenard Gower, NP [employer] [phone]

## 2016-03-05 ENCOUNTER — Telehealth: Payer: Self-pay | Admitting: Diagnostic Neuroimaging

## 2016-03-05 NOTE — Telephone Encounter (Signed)
Pt's daughter, [name],  called in about pt's driving privileges. States pt's pcp is telling the pt he will need a physical driving test. She is asking if the physician here can add on to that. She is also asking that the pt not know she has called. She does not feel like he should be driving. Pt has been told he can not drive until he sees Dr. Marjory LiesPenumalli and the Physical Therapist are telling him he can not drive while under treatment. Can the pt be evaluated in some way? Please call [phone]- Fleet Contrasachel

## 2016-03-05 NOTE — Telephone Encounter (Signed)
Returned patient's daughter, Evalina FieldRachels' call. She stated patient's PCP, [place] Karna ChristmasWilliam Watkins[place] advised patient he may need to take a driving test since his stroke. PCP stated he would refer [place] to [facility] [place]. [name] stated her father is not wanting to go but may consider going to one closer. She stated she does not think he should drive, c/o numbness of his feet at times. She stated he has been instructed not to drive anywhere except locally. She stated she is concerned for his safety and safety of others. Informed her that [place] that [place] driving tests. She stated she must "remain on the good side of her father" in this regard, so she would like Dr Marjory LiesPenumalli to discuss with her father. This RN advised her Dr Marjory LiesPenumalli will conduct a neuro exam on patient and discuss his findings. [name] [place] Jamal Collindvised [name] that Dr Marjory LiesPenumalli may discuss she and her father having a conversation to discuss his living alone situation. She stated he currently has help through [facility] Mon-Fri for 3 hrs a day. She restated that she does not want her father to know she made this call. She will accompany him to his appointment with Dr Marjory LiesPenumalli, and [facility]. Requested she bring a copy with her. Advised her this note will be routed to Dr Marjory LiesPenumalli for his information. She verbalized understanding, appreciation of call.

## 2016-03-07 ENCOUNTER — Ambulatory Visit (INDEPENDENT_AMBULATORY_CARE_PROVIDER_SITE_OTHER): Payer: Medicare Other | Admitting: Diagnostic Neuroimaging

## 2016-03-07 ENCOUNTER — Encounter: Payer: Self-pay | Admitting: Diagnostic Neuroimaging

## 2016-03-07 VITALS — BP 116/52 | HR 64 | Ht 67.0 in | Wt 171.0 lb

## 2016-03-07 DIAGNOSIS — I63441 Cerebral infarction due to embolism of right cerebellar artery: Secondary | ICD-10-CM

## 2016-03-07 NOTE — Patient Instructions (Signed)
-   consider cardiac heart monitor; however given age and balance issues, patient may not be a good candidate for anticoagulation  - increase safety / supervision; agree with home health therapy and aid as needed  - continue aspirin / plavix, BP meds

## 2016-03-07 NOTE — Progress Notes (Signed)
[facility]  PATIENT: Bruce Clarke DOB: 12/04/1916  REFERRING CLINICIAN: Xu HISTORY FROM: patient, daughter, hospital notes REASON FOR VISIT: new consult   HISTORICAL  CHIEF COMPLAINT:  Chief Complaint  Patient presents with  . Stroke    rm 6, New Pt, hosp FU, dgtr- [name]    HISTORY OF PRESENT ILLNESS:   82 year old male with hypertension, hyperlipidemia, coronary artery disease, here for evaluation of stroke. 01/27/16 patient was having stomach pain and nausea. 01/28/16 patient had started to have balance and walking difficulty with nausea and vomiting. On 01/29/16 patient went to the emergency room for evaluation. CT scan and then MRI of the brain confirmed a right cerebellar ischemic infarction. Patient was admitted for stroke workup. Patient went to short-term rehabilitation and then has moved home to [facility].  Patient now living at home alone. He has home health physical therapy and home health aide a few hours per day. He is a ambulating with a walker. He is not driving. His daughter and her family lives in [place].    REVIEW OF SYSTEMS: Full 14 system review of systems performed and negative with exception of: Walking difficulty leg swelling hearing loss.  ALLERGIES: Allergies  Allergen Reactions  . Ibuprofen Swelling    HOME MEDICATIONS: Outpatient Medications Prior to Visit  Medication Sig Dispense Refill  . aspirin EC 81 MG tablet Take 1 tablet (81 mg total) by mouth daily. Continue aspirin and Plavix for 3 months and then Plavix only    . brimonidine-timolol (COMBIGAN) 0.2-0.5 % ophthalmic solution Place 1 drop into both eyes every 12 (twelve) hours.    . Cholecalciferol (VITAMIN D-1000 MAX ST) 1000 units tablet Take 1 tablet by mouth daily.    . clopidogrel (PLAVIX) 75 MG tablet Take 1 tablet (75 mg total) by mouth daily.    . finasteride (PROSCAR) 5 MG tablet Take 5 mg by mouth daily.    . furosemide (LASIX) 40 MG tablet Take 1  tablet by mouth daily.    Marland Kitchen ketoconazole (NIZORAL) 2 % cream Apply 1 application topically as directed.    Marland Kitchen lisinopril (PRINIVIL,ZESTRIL) 5 MG tablet Take 1 tablet by mouth daily.     . potassium chloride (MICRO-K) 10 MEQ CR capsule Take 1 capsule by mouth daily.    . simvastatin (ZOCOR) 20 MG tablet Take 1 tablet by mouth daily.     Marland Kitchen latanoprost (XALATAN) 0.005 % ophthalmic solution Place 1 drop into both eyes at bedtime.    Marland Kitchen loratadine (CLARITIN) 10 MG tablet Take 10 mg by mouth daily as needed for allergies.    . nitroGLYCERIN (NITROSTAT) 0.4 MG SL tablet Take 1 tablet by mouth as directed.    . sennosides-docusate sodium (SENOKOT-S) 8.6-50 MG tablet Take 2 tablets by mouth 2 (two) times daily.    . vitamin B-12 (CYANOCOBALAMIN) 100 MCG tablet Take 100 mcg by mouth daily.     No facility-administered medications prior to visit.     PAST MEDICAL HISTORY: Past Medical History:  Diagnosis Date  . Acute CVA (cerebrovascular accident) (HCC) 01/2016  . Chronic diastolic CHF (congestive heart failure) (HCC)   . Coronary artery disease   . Dehydration   . Glaucoma   . Hyperlipidemia   . Hypertension   . Stable angina (HCC)     PAST SURGICAL HISTORY: Past Surgical History:  Procedure Laterality Date  . CATARACT EXTRACTION, BILATERAL    . CORONARY ARTERY BYPASS GRAFT     3-vessel around 1990.  [facility]  . UMBILICAL HERNIA REPAIR     x 3    FAMILY HISTORY: Family History  Problem Relation Age of Onset  . CAD Mother   . Diabetes Mother   . Eczema Mother   . CAD Father     lived to 80 yo  . Stroke Father     SOCIAL HISTORY:  Social History   Social History  . Marital status: Single    Spouse name: N/A  . Number of children: 1  . Years of education: 4718   Occupational History  .      retired   Social History Main Topics  . Smoking status: Former Games developermoker  . Smokeless tobacco: Never Used  . Alcohol use No  . Drug use: No  . Sexual activity: Not on  file   Other Topics Concern  . Not on file   Social History Narrative   Lives alone   Caffeine - coffee, 3 cups daily     PHYSICAL EXAM  GENERAL EXAM/CONSTITUTIONAL: Vitals:  Vitals:   03/07/16 1318  BP: (!) 116/52  Pulse: 64  Weight: 171 lb (77.6 kg)  Height: 5\' 7"  (1.702 m)     Body mass index is 26.78 kg/m.  No exam data present  Patient is in no distress; well developed, nourished and groomed; neck is supple  CARDIOVASCULAR:  Examination of carotid arteries is normal; no carotid bruits  Regular rate and rhythm, no murmurs  Examination of peripheral vascular system by observation and palpation is normal  EYES:  Ophthalmoscopic exam of optic discs and posterior segments is normal; no papilledema or hemorrhages  MUSCULOSKELETAL:  Gait, strength, tone, movements noted in Neurologic exam below  NEUROLOGIC: MENTAL STATUS:  No flowsheet data found.  awake, alert, oriented to person, place and time  recent and remote memory intact  normal attention and concentration  language fluent, comprehension intact, naming intact,   fund of knowledge appropriate  CRANIAL NERVE:   2nd - no papilledema on fundoscopic exam  2nd, 3rd, 4th, 6th - pupils equal and reactive to light, visual fields full to confrontation, extraocular muscles intact, no nystagmus  5th - facial sensation symmetric  7th - facial strength symmetric  8th - hearing intact  9th - palate elevates symmetrically, uvula midline  11th - shoulder shrug symmetric  12th - tongue protrusion midline  MOTOR:   normal bulk and tone, full strength in the BUE, BLE  SENSORY:   normal and symmetric to light touch, temperature, vibration  ABSENT VIB AT TOES AND ANKLES  COORDINATION:   finger-nose-finger, fine finger movements normal  REFLEXES:   deep tendon reflexes TRACE and symmetric  GAIT/STATION:   narrow based gait; USES WALKER    DIAGNOSTIC DATA (LABS, IMAGING, TESTING) -  I reviewed patient records, labs, notes, testing and imaging myself where available.  Lab Results  Component Value Date   WBC 6.3 02/07/2016   HGB 14.3 02/07/2016   HCT 44 02/07/2016   MCV 90.9 02/02/2016   PLT 252 02/07/2016      Component Value Date/Time   NA 141 02/07/2016   K 4.4 02/07/2016   CL 98 (L) 02/02/2016 0149   CO2 27 02/02/2016 0149   GLUCOSE 129 (H) 02/02/2016 0149   BUN 20 02/07/2016   CREATININE 0.8 02/07/2016   CREATININE 0.82 02/02/2016 0149   CALCIUM 9.2 02/02/2016 0149   PROT 6.8 01/29/2016 0954   ALBUMIN 4.1 01/29/2016 0954   AST 21 01/29/2016 0954  ALT 17 01/29/2016 0954   ALKPHOS 54 01/29/2016 0954   BILITOT 1.2 01/29/2016 0954   GFRNONAA >60 02/02/2016 0149   GFRAA >60 02/02/2016 0149   Lab Results  Component Value Date   CHOL 126 01/30/2016   HDL 34 (L) 01/30/2016   LDLCALC 59 01/30/2016   TRIG 166 (H) 01/30/2016   CHOLHDL 3.7 01/30/2016   Lab Results  Component Value Date   HGBA1C 5.8 (H) 01/30/2016   Lab Results  Component Value Date   VITAMINB12 982 (H) 05/20/2010   Lab Results  Component Value Date   TSH 1.591 06/06/2010    9/24/178 MRI brain [I reviewed images myself and agree with interpretation. -[facility]P]  1. Acute large right cerebellar PICA territory infarct. 2. Punctate acute infarct versus artifact in the pons. 3. Mild chronic small vessel ischemic disease.  01/30/16 CTA HEAD:  - Acute RIGHT posterior-inferior cerebellar artery occlusion within 1 cm of the origin. - Moderate tandem stenosis bilateral posterior cerebral arteries compatible with atherosclerosis. - Mild anterior circulation atherosclerosis. Severe calcific atherosclerosis of the carotid siphons.  01/30/16 CTA NECK:  - Atherosclerosis without hemodynamically significant stenosis or acute vascular process.  01/31/16 TTE  - Normal LV size with EF 50%. Basal to mid inferior hypokinesis. Normal RV size and systolic function. Aortic valve sclerosis without  significant stenosis.  01/29/16 EKG - sinus rhythm      ASSESSMENT AND PLAN  82 y.o. year old male here with hypertension, hypercholesteremia, with new right cerebellar ischemic infarction. Patient completed skilled nursing facility rehabilitation and now living at home.  Dx: RIGHT CEREBELLAR STROKE (right PICA; large vessel atherosclerosis / artery-artery embolism vs cardioembolic)  1. Cerebrovascular accident (CVA) due to embolism of right cerebellar artery (HCC)      PLAN: I spent 40 minutes of face to face time with patient. Greater than 50% of time was spent in counseling and coordination of care with patient. In summary we discussed:  - consider cardiac heart monitor; however given age and balance issues, patient may not be a good candidate for anticoagulation - continue aspirin / plavix, BP meds - diagnosis, prognosis, treatment options reviewed; - increase safety / supervision; agree with home health therapy and aid as needed; patient should not be driving a car at this time  Return if symptoms worsen or fail to improve, for return to PCP.    Suanne MarkerVIKRAM R. PENUMALLI, MD 03/07/2016, 1:41 PM Certified in Neurology, Neurophysiology and Neuroimaging  [facility] [address], [address] [place], [place] [address] [phone]

## 2017-05-07 DEATH — deceased

## 2018-04-02 IMAGING — MR MR HEAD W/O CM
8 of 10 series · 39 of 48 positions shown · non-contrast
Comparison: 05/23/2010

CLINICAL DATA: Dizziness with gait disturbance. Nausea, vomiting,
and weakness for 2 days.

EXAM:
MRI HEAD WITHOUT CONTRAST
TECHNIQUE: Multiplanar, multiecho pulse sequences of the brain and surrounding
structures were obtained without intravenous contrast.

[Series 3: T1 · sagittal · 5.0mm · 0.47mm/px · 1 of 27 slices shown]
[im 1/27]
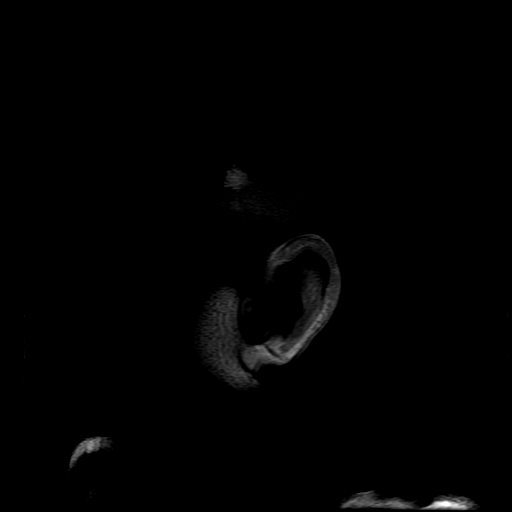

[Series 4: DWI · axial · 3.0mm · 1.09mm/px · z∈[-46,+113]mm · 11 of 108 slices shown (1 of 4)]
[im 1/108]
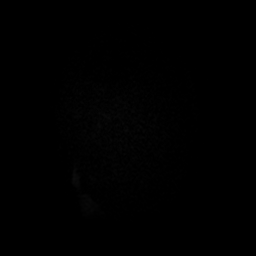
[im 11/108]
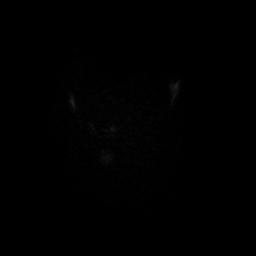
[im 22/108]
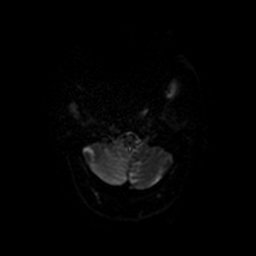
[im 33/108]
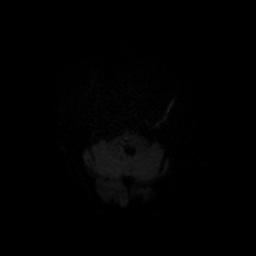
[im 43/108]
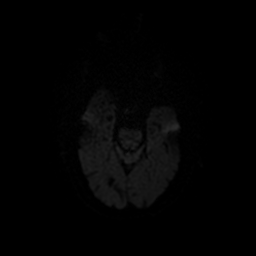
[im 54/108]
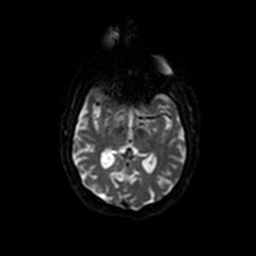
[im 65/108]
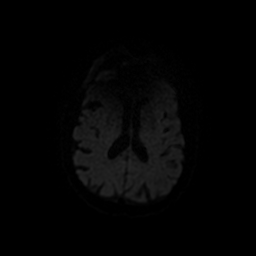
[im 75/108]
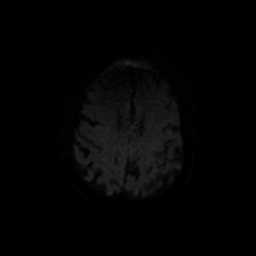
[im 86/108]
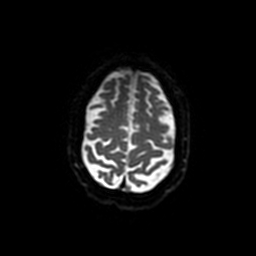
[im 97/108]
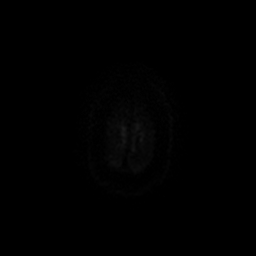
[im 108/108]
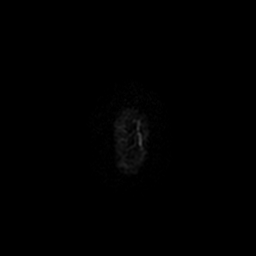

[Series 5: DWI · coronal · 5.0mm · 1.09mm/px · 8 of 75 slices shown (2 of 4)]
[im 1/75]
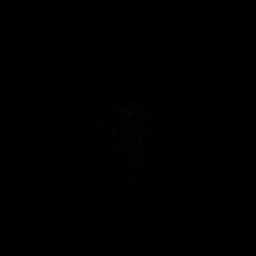
[im 11/75]
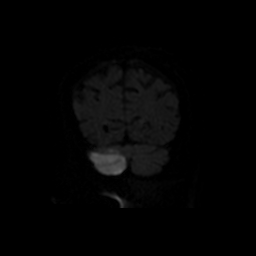
[im 22/75]
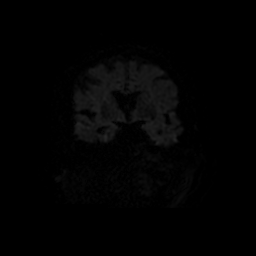
[im 32/75]
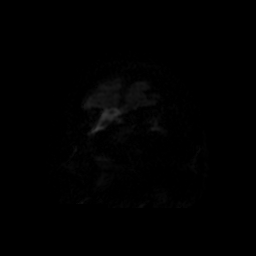
[im 43/75]
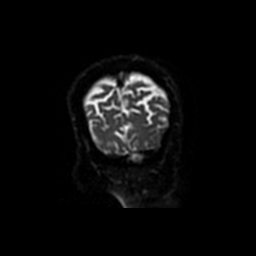
[im 53/75]
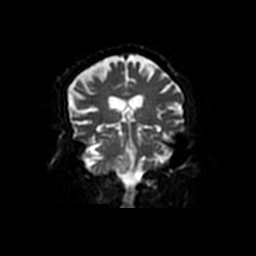
[im 64/75]
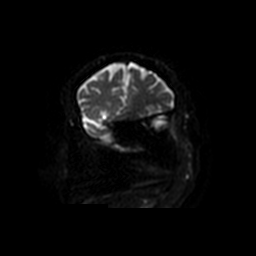
[im 75/75]
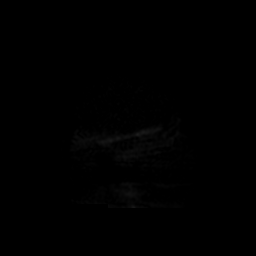

[Series 6: T2 · axial · 5.0mm · 0.43mm/px · z∈[-42,+114]mm · 3 of 25 slices shown]
[im 1/25]
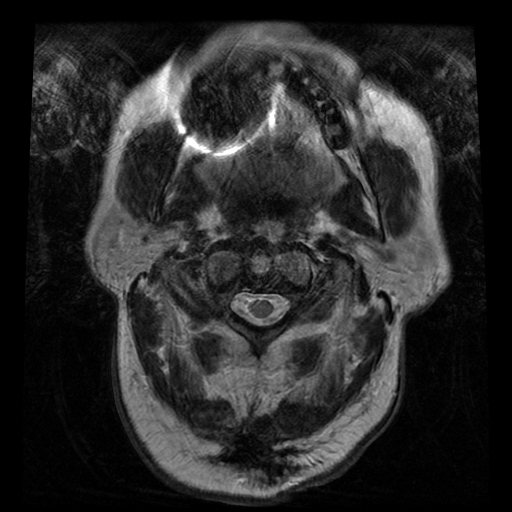
[im 13/25]
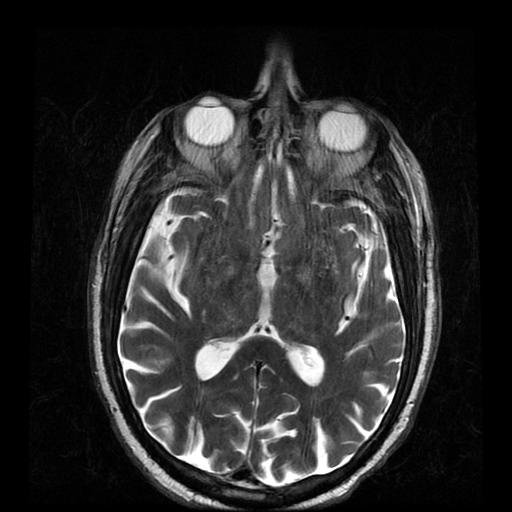
[im 25/25]
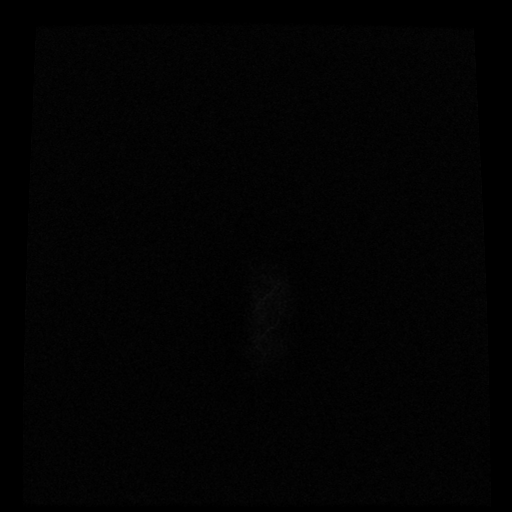

[Series 7: FLAIR · axial · 5.0mm · 0.43mm/px · z∈[-48,+120]mm · 3 of 25 slices shown]
[im 1/25]
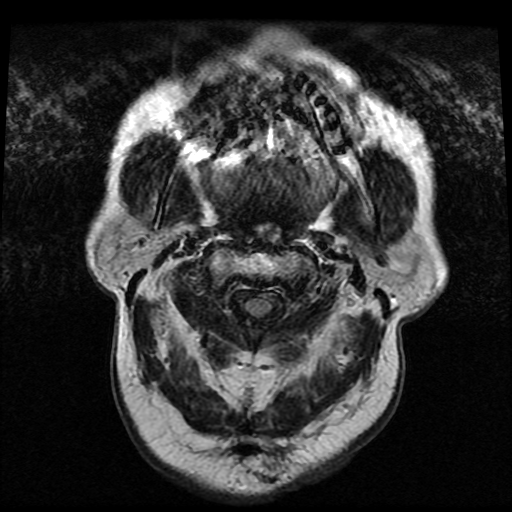
[im 13/25]
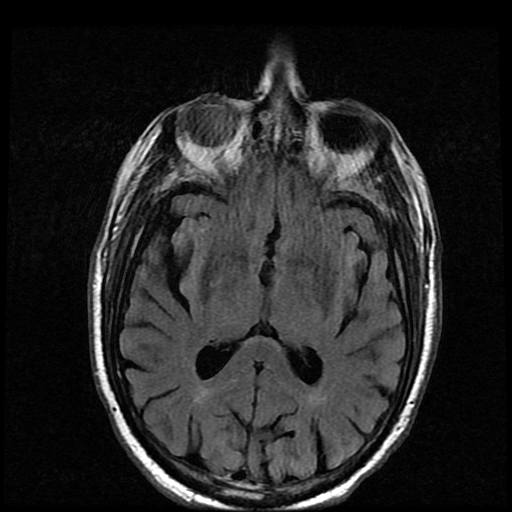
[im 25/25]
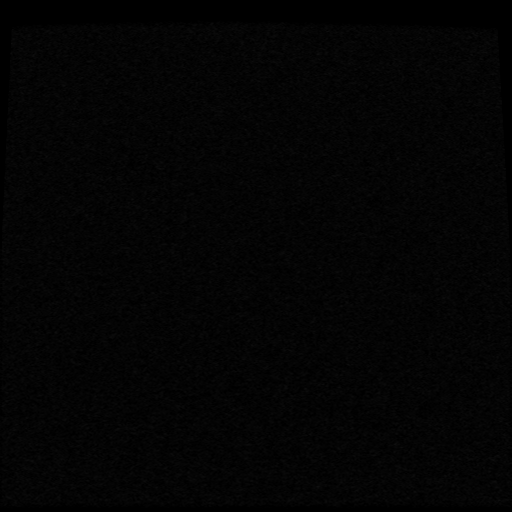

[Series 10: T2 post-contrast · coronal · 5.0mm · 0.45mm/px · 3 of 30 slices shown]
[im 1/30]
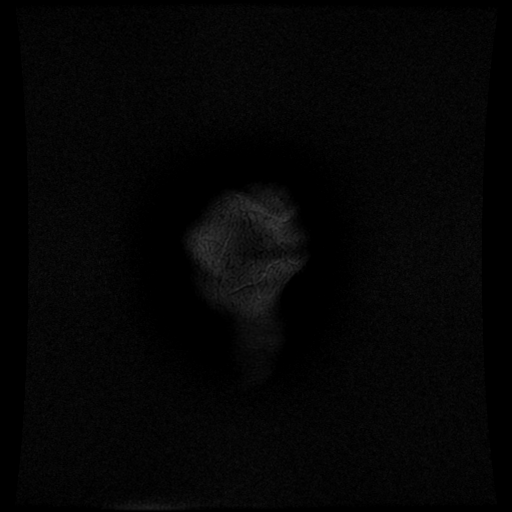
[im 15/30]
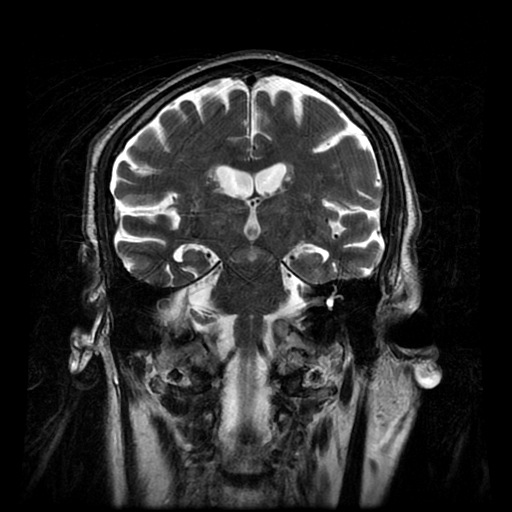
[im 30/30]
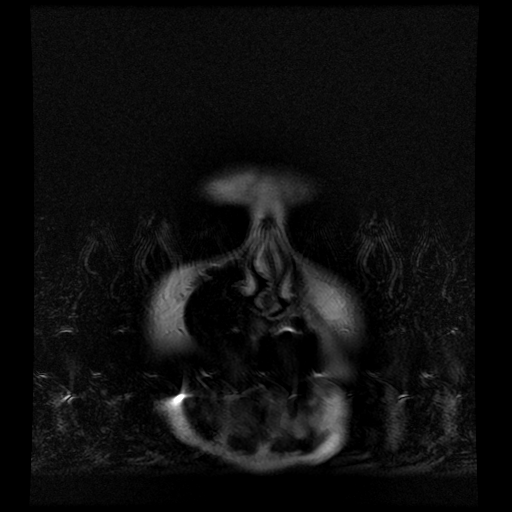

[Series 400: DWI · axial · 3.0mm · 1.09mm/px · z∈[-46,+113]mm · 6 of 54 slices shown (3 of 4)]
[im 1/54]
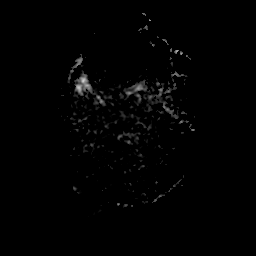
[im 11/54]
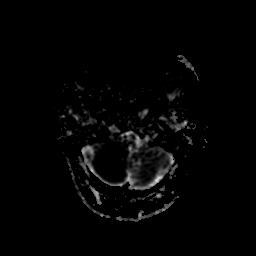
[im 22/54]
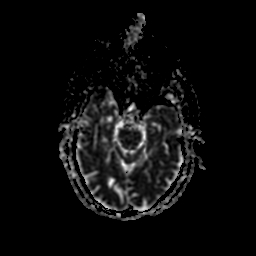
[im 32/54]
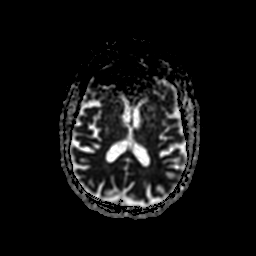
[im 43/54]
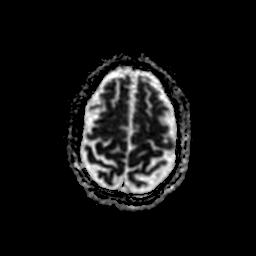
[im 54/54]
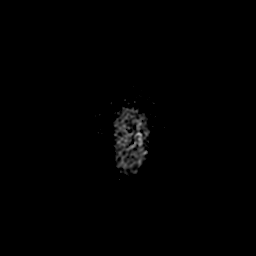

[Series 500: DWI · coronal · 5.0mm · 1.09mm/px · 4 of 40 slices shown (4 of 4)]
[im 1/40]
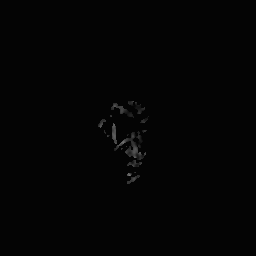
[im 14/40]
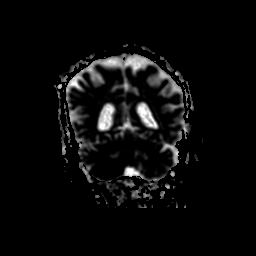
[im 27/40]
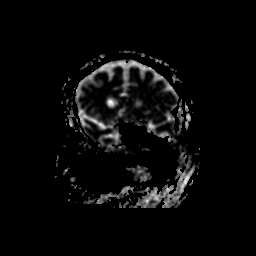
[im 40/40]
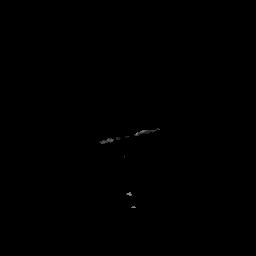

[39 of 48 positions shown; findings below may reference images not displayed]

FINDINGS: Brain: There is a large acute right cerebellar PICA territory
infarct. There is associated cytotoxic edema without significant
posterior fossa mass effect or evidence of associated hemorrhage.
There is a punctate acute infarct versus artifact in the right lower
pons, not confirmed on coronal imaging.

Partially empty sella is unchanged. Chronic microhemorrhages are
noted in posterior right frontal cortex and left centrum semiovale.
No mass, midline shift, or extra-axial fluid collection is seen.
There is age-appropriate cerebral atrophy. Deep cerebral white
matter T2 hyperintensities have mildly progressed from 9499 and are
nonspecific but compatible with mild chronic small vessel ischemic
disease.

Vascular: Major intracranial vascular flow voids are preserved.

Skull and upper cervical spine: No suspicious osseous lesion.

Sinuses/Orbits: Prior bilateral cataract extraction.  Clear sinuses.

Other: None.
IMPRESSION: 1. Acute large right cerebellar PICA territory infarct.
2. Punctate acute infarct versus artifact in the pons.
3. Mild chronic small vessel ischemic disease.
